# Patient Record
Sex: Female | Born: 1970 | Race: White | Hispanic: No | Smoking: Never smoker
Health system: Southern US, Community
[De-identification: ages and names within clinical notes are randomized; demographics above are authoritative.]

## PROBLEM LIST (undated history)

## (undated) DIAGNOSIS — B009 Herpesviral infection, unspecified: Secondary | ICD-10-CM

## (undated) DIAGNOSIS — K219 Gastro-esophageal reflux disease without esophagitis: Secondary | ICD-10-CM

## (undated) DIAGNOSIS — J302 Other seasonal allergic rhinitis: Secondary | ICD-10-CM

## (undated) HISTORY — PX: BREAST BIOPSY: SHX20

## (undated) HISTORY — PX: COLONOSCOPY: SHX174

## (undated) HISTORY — PX: TUBAL LIGATION: SHX77

## (undated) HISTORY — PX: WISDOM TOOTH EXTRACTION: SHX21

## (undated) HISTORY — PX: UPPER GI ENDOSCOPY: SHX6162

## (undated) HISTORY — PX: BREAST EXCISIONAL BIOPSY: SUR124

---

## 2001-09-21 ENCOUNTER — Encounter: Admission: RE | Admit: 2001-09-21 | Discharge: 2001-09-21 | Payer: Self-pay | Admitting: Family Medicine

## 2001-09-21 ENCOUNTER — Encounter: Payer: Self-pay | Admitting: Family Medicine

## 2002-01-29 ENCOUNTER — Encounter: Payer: Self-pay | Admitting: Family Medicine

## 2002-01-29 ENCOUNTER — Encounter: Admission: RE | Admit: 2002-01-29 | Discharge: 2002-01-29 | Payer: Self-pay | Admitting: Family Medicine

## 2002-08-30 ENCOUNTER — Encounter: Admission: RE | Admit: 2002-08-30 | Discharge: 2002-08-30 | Payer: Self-pay | Admitting: Family Medicine

## 2002-08-30 ENCOUNTER — Encounter: Payer: Self-pay | Admitting: Family Medicine

## 2004-06-16 ENCOUNTER — Other Ambulatory Visit: Admission: RE | Admit: 2004-06-16 | Discharge: 2004-06-16 | Payer: Self-pay | Admitting: Family Medicine

## 2005-01-14 ENCOUNTER — Other Ambulatory Visit: Admission: RE | Admit: 2005-01-14 | Discharge: 2005-01-14 | Payer: Self-pay | Admitting: Family Medicine

## 2005-01-14 ENCOUNTER — Ambulatory Visit (HOSPITAL_BASED_OUTPATIENT_CLINIC_OR_DEPARTMENT_OTHER): Admission: RE | Admit: 2005-01-14 | Discharge: 2005-01-14 | Payer: Self-pay | Admitting: Orthopedic Surgery

## 2005-08-10 ENCOUNTER — Encounter: Admission: RE | Admit: 2005-08-10 | Discharge: 2005-08-10 | Payer: Self-pay | Admitting: Family Medicine

## 2005-12-31 ENCOUNTER — Encounter (INDEPENDENT_AMBULATORY_CARE_PROVIDER_SITE_OTHER): Payer: Self-pay | Admitting: Specialist

## 2005-12-31 ENCOUNTER — Ambulatory Visit (HOSPITAL_COMMUNITY): Admission: RE | Admit: 2005-12-31 | Discharge: 2005-12-31 | Payer: Self-pay | Admitting: Gastroenterology

## 2006-01-07 ENCOUNTER — Ambulatory Visit (HOSPITAL_COMMUNITY): Admission: RE | Admit: 2006-01-07 | Discharge: 2006-01-07 | Payer: Self-pay | Admitting: *Deleted

## 2006-05-23 ENCOUNTER — Emergency Department (HOSPITAL_COMMUNITY): Admission: EM | Admit: 2006-05-23 | Discharge: 2006-05-23 | Payer: Self-pay | Admitting: Emergency Medicine

## 2007-08-28 ENCOUNTER — Other Ambulatory Visit: Admission: RE | Admit: 2007-08-28 | Discharge: 2007-08-28 | Payer: Self-pay | Admitting: Family Medicine

## 2007-09-27 ENCOUNTER — Encounter: Admission: RE | Admit: 2007-09-27 | Discharge: 2007-09-27 | Payer: Self-pay | Admitting: Family Medicine

## 2009-01-31 ENCOUNTER — Other Ambulatory Visit: Admission: RE | Admit: 2009-01-31 | Discharge: 2009-01-31 | Payer: Self-pay | Admitting: Family Medicine

## 2009-01-31 ENCOUNTER — Encounter: Admission: RE | Admit: 2009-01-31 | Discharge: 2009-01-31 | Payer: Self-pay | Admitting: Family Medicine

## 2010-05-29 ENCOUNTER — Other Ambulatory Visit: Admission: RE | Admit: 2010-05-29 | Discharge: 2010-05-29 | Payer: Self-pay | Admitting: Family Medicine

## 2010-05-29 ENCOUNTER — Encounter: Admission: RE | Admit: 2010-05-29 | Discharge: 2010-05-29 | Payer: Self-pay | Admitting: Family Medicine

## 2010-12-20 ENCOUNTER — Encounter: Payer: Self-pay | Admitting: Family Medicine

## 2011-04-16 NOTE — Op Note (Signed)
[name], Flores                ACCOUNT NO.:  [id number]   MEDICAL RECORD NO.:  [id number]          PATIENT TYPE:  AMB   LOCATION:  DSC                          FACILITY:  [facility]   PHYSICIAN:  Nadara Mustard, MD     DATE OF BIRTH:  Nov 19, 1971   DATE OF PROCEDURE:  01/14/2005  DATE OF DISCHARGE:                                 OPERATIVE REPORT   PREOPERATIVE DIAGNOSIS:  Impingement syndrome, left ankle.   POSTOPERATIVE DIAGNOSIS:  Impingement syndrome, left ankle.   OPERATION PERFORMED:  Left ankle arthroscopy with debridement of synovitis.   SURGEON:  Nadara Mustard, M.D.   ANESTHESIA:  Popliteal block.   ESTIMATED BLOOD LOSS:  Minimal.   MEDICATIONS:  Antibiotics; 1 gram Kefzol.   TOURNIQUET TIME:  None.   DISPOSITION:  To the PACU in stable condition.   INDICATIONS FOR SURGERY:  The patient is a 40 year old woman with  impingement symptoms in her left ankle.  She has obtained temporary relief  with intra-articular injections.  She has failed conservative care, has pain  with activities of daily living and presents at this time for arthroscopic  intervention.  The risks and benefits were discussed including infection,  neurovascular injury, persistent pain and need for additional surgery.  The  patient states she understands and wishes to proceed at this time.   DESCRIPTION OF OPERATION:  The patient was brought to OR room 6 after  undergoing a popliteal block.  After an adequate level of anesthesia was  obtained the patient's left lower extremity was prepped using DuraPrep and  draped into a sterile field.  An 18-gauge spinal needle was inserted through  the anteromedial portal and the joint was infused with a total of 8 mL of  normal saline.  The skin was then incised and with blunt dissection was  carried down to the capsule.  A blunt trocar was inserted into the joint and  the scope was inserted through the anteromedial portal.   Using guidance from the camera  light against the skin an anterolateral  portal was established first with an 18-gauge spinal to avoid the  neurovascular structures.  The skin was then incised.  Blunt dissection was  carried down to the capsule, and the shaver and vapor wand were inserted  through the anterolateral portal.  Visualization showed a significant amount  of synovitis with the impingement anteriorly.  This was first debrided with  a shaver and then secondarily debrided with the vapor wand.  After  debridement of the synovitis, which involved both the mediolateral and  anterior aspects of the joint, the patient had a good articular surface with  no osteochondral defects of the talus or tibia.  After further survey of the  joint the instruments were removed.   The portals were closed using 3-0 nylon.  The wounds were covered with  Adaptic, orthopedic sponges, sterile Webril and a Coban dressing.  The  patient was placed in ankle traction throughout the case and this was  removed afterwards.  There was approximately 10 pounds of axial traction.   The patient  was then taken to the PACU in stable condition.   PLAN:  1.  The plan is to follow up in the office in two weeks.  2.  The patient has a prescription for Vicodin.  3.  The patient will be touch-down weightbearing on the left.  4.  The patient will change the dressing in two days.      MVD/MEDQ  D:  01/14/2005  T:  01/15/2005  Job:  [id number]

## 2011-04-16 NOTE — Op Note (Signed)
NAMESAIGE, CANTON                ACCOUNT NO.:  [id number]   MEDICAL RECORD NO.:  [id number]          PATIENT TYPE:  AMB   LOCATION:  SDC                           FACILITY:  [facility]   PHYSICIAN:  [place] B. Earlene Plater, M.D.  DATE OF BIRTH:  Mar 23, 1971   DATE OF PROCEDURE:  01/07/2006  DATE OF DISCHARGE:                                 OPERATIVE REPORT   PREOPERATIVE DIAGNOSIS:  Desires tubal sterilization.   POSTOPERATIVE DIAGNOSIS:  Desires tubal sterilization.   OPERATION/PROCEDURE:  Attempted Essure tubal sterilization with uterine  perforation with the hysteroscope converted to diagnostic laparoscopy with  coagulation of the uterine perforations site and bilateral tubal ligation  with bipolar cautery.   SURGEON:  Chester Holstein. Earlene Plater, M.D.   ASSISTANT:  None.   ANESTHESIA:  General.   SPECIMENS:  None.   ESTIMATED BLOOD LOSS:  Minimal.   COMPLICATIONS:  Uterine perforation as outlined above.   INDICATIONS:  The patient desires tubal sterilization.  She preferred the  Essure if possible and was advised of the risks including infection,  bleeding, uterine perforation, damage to surrounding organs, and 5-10%  chance of non-visualization or noncanalization of the tube requiring  laparoscopy to complete the procedure.   DESCRIPTION OF PROCEDURE:  The patient was taken to the operating room and  initially LMA anesthesia obtained. She was prepped and draped in the  standard fashion.  The bladder was emptied with in-and-out catheter.  Examination under anesthesia showed a mid plane to slightly anteverted  uterus.  No adnexal masses.   Speculum inserted and the cervix noted to be extremely narrow.  Paracervical  block placed.  Single-tooth tenaculum attached to the anterior lip of the  cervix.  The cervix was easily dilated with the tapered dilators up to a #19  without difficulty.  The Essure scope was then inserted but would not go  into the cavity without difficulty.  At the internal  os, there was slight  resistance which suddenly gave away and it was evident immediately that  uterine perforation had occurred as the sigmoid colon and surrounding fat  were visible.  It did not appear that perforation of the sigmoid had  occurred as it was still distant in the visual field.  Therefore, the  procedure was converted to laparoscopy.   Gowns and gloves were changed.  The patient was reprepped and draped.  Vertical incision made in the umbilicus, carried sharply through the fascia.  The fascia was opened and divided sharply and elevated with the Kocher  clamps.  Posterior sheath and peritoneum were elevated with Allis clamps and  entered sharply.  Pursestring suture of 0 Vicryl were placed around the  fascial defect.  Hasson cannula inserted and secured. Pneumoperitoneum was  obtained with CO2 gas.  Tendelenberg position obtained.  A 5 mm port was  placed in the left lower quadrant.  There was a collection of blood to the  right of midline at the fundus poteriorly consistent with perforation site.  This was irrigated and inspected.  There was a single perforation site  approximately 3-4 mm in diameter.  It was oozing slightly.  This was  coagulated with bipolar cautery.  The sigmoid colon was carefully inspected  along its entire course and there was no evidence for injury.  The  surrounding fat was also similarly undisturbed.  Small bowel was also  inspected and showed no signs of injury.   Each tube was then identified, followed to its fimbriated end.  It was then  cauterized in a triple burn fashion with bipolar cautery.   The pneumoperitoneum was reduced such that the pressure was below 6 mm and  the perforation site again inspected.  It was hemostatic.  Therefore, the  procedure was terminated.  The left lower quadrant port was removed and its  inspected laparoscopically.  It was hemostatic.   The scope was removed and gas released. Hasson cannula removed.  Fascial   defect at the umbilicus elevated with [employer] retractors.  Pursestring  suture snugged down. This obliterated the fascial defect.  No intra-  abdominal contents herniated through prior to closure.  Skin was closed at  the umbilicus and the left lower quadrant with subcuticular 4-0 Vicryl.   The patient tolerated the procedure well.  Counts were correct per the  operating room staff.      Gerri Spore B. Earlene Plater, M.D.  Electronically Signed     WBD/MEDQ  D:  01/07/2006  T:  01/07/2006  Job:  [id number]

## 2011-11-10 ENCOUNTER — Other Ambulatory Visit: Payer: Self-pay | Admitting: Family Medicine

## 2011-11-10 DIAGNOSIS — Z1231 Encounter for screening mammogram for malignant neoplasm of breast: Secondary | ICD-10-CM

## 2011-11-11 ENCOUNTER — Other Ambulatory Visit (HOSPITAL_COMMUNITY)
Admission: RE | Admit: 2011-11-11 | Discharge: 2011-11-11 | Disposition: A | Discharge status: Home / Self Care | Payer: BC Managed Care – PPO | Source: Ambulatory Visit | Attending: Family Medicine | Admitting: Family Medicine

## 2011-11-11 ENCOUNTER — Other Ambulatory Visit: Payer: Self-pay | Admitting: Family Medicine

## 2011-11-11 ENCOUNTER — Ambulatory Visit
Admission: RE | Admit: 2011-11-11 | Discharge: 2011-11-11 | Disposition: A | Discharge status: Home / Self Care | Payer: BC Managed Care – PPO | Source: Ambulatory Visit | Attending: Family Medicine | Admitting: Family Medicine

## 2011-11-11 DIAGNOSIS — Z124 Encounter for screening for malignant neoplasm of cervix: Secondary | ICD-10-CM | POA: Insufficient documentation

## 2011-11-11 DIAGNOSIS — Z1231 Encounter for screening mammogram for malignant neoplasm of breast: Secondary | ICD-10-CM

## 2011-11-11 DIAGNOSIS — R87810 Cervical high risk human papillomavirus (HPV) DNA test positive: Secondary | ICD-10-CM | POA: Insufficient documentation

## 2012-07-10 ENCOUNTER — Other Ambulatory Visit (HOSPITAL_COMMUNITY)
Admission: RE | Admit: 2012-07-10 | Discharge: 2012-07-10 | Disposition: A | Discharge status: Home / Self Care | Payer: BC Managed Care – PPO | Source: Ambulatory Visit | Attending: Family Medicine | Admitting: Family Medicine

## 2012-07-10 DIAGNOSIS — R87619 Unspecified abnormal cytological findings in specimens from cervix uteri: Secondary | ICD-10-CM | POA: Insufficient documentation

## 2012-07-11 ENCOUNTER — Other Ambulatory Visit: Payer: Self-pay | Admitting: Family Medicine

## 2012-11-20 ENCOUNTER — Other Ambulatory Visit: Payer: Self-pay | Admitting: Family Medicine

## 2012-11-20 DIAGNOSIS — Z1231 Encounter for screening mammogram for malignant neoplasm of breast: Secondary | ICD-10-CM

## 2012-12-15 ENCOUNTER — Other Ambulatory Visit (HOSPITAL_COMMUNITY)
Admission: RE | Admit: 2012-12-15 | Discharge: 2012-12-15 | Disposition: A | Discharge status: Home / Self Care | Payer: BC Managed Care – PPO | Source: Ambulatory Visit | Attending: Family Medicine | Admitting: Family Medicine

## 2012-12-15 ENCOUNTER — Ambulatory Visit
Admission: RE | Admit: 2012-12-15 | Discharge: 2012-12-15 | Disposition: A | Discharge status: Home / Self Care | Payer: BC Managed Care – PPO | Source: Ambulatory Visit | Attending: Family Medicine | Admitting: Family Medicine

## 2012-12-15 ENCOUNTER — Other Ambulatory Visit: Payer: Self-pay | Admitting: Internal Medicine

## 2012-12-15 DIAGNOSIS — R87612 Low grade squamous intraepithelial lesion on cytologic smear of cervix (LGSIL): Secondary | ICD-10-CM | POA: Insufficient documentation

## 2012-12-15 DIAGNOSIS — Z1231 Encounter for screening mammogram for malignant neoplasm of breast: Secondary | ICD-10-CM

## 2013-11-19 ENCOUNTER — Other Ambulatory Visit: Payer: Self-pay

## 2013-11-19 DIAGNOSIS — Z1231 Encounter for screening mammogram for malignant neoplasm of breast: Secondary | ICD-10-CM

## 2013-12-17 ENCOUNTER — Ambulatory Visit
Admission: RE | Admit: 2013-12-17 | Discharge: 2013-12-17 | Disposition: A | Discharge status: Home / Self Care | Payer: BC Managed Care – PPO | Source: Ambulatory Visit

## 2013-12-17 DIAGNOSIS — Z1231 Encounter for screening mammogram for malignant neoplasm of breast: Secondary | ICD-10-CM

## 2014-01-16 ENCOUNTER — Other Ambulatory Visit (HOSPITAL_COMMUNITY)
Admission: RE | Admit: 2014-01-16 | Discharge: 2014-01-16 | Disposition: A | Discharge status: Home / Self Care | Payer: BC Managed Care – PPO | Source: Ambulatory Visit | Attending: Family Medicine | Admitting: Family Medicine

## 2014-01-16 ENCOUNTER — Other Ambulatory Visit: Payer: Self-pay | Admitting: Family Medicine

## 2014-01-16 DIAGNOSIS — Z124 Encounter for screening for malignant neoplasm of cervix: Secondary | ICD-10-CM | POA: Insufficient documentation

## 2014-11-27 ENCOUNTER — Other Ambulatory Visit: Payer: Self-pay

## 2014-11-27 DIAGNOSIS — Z1231 Encounter for screening mammogram for malignant neoplasm of breast: Secondary | ICD-10-CM

## 2014-12-23 ENCOUNTER — Ambulatory Visit
Admission: RE | Admit: 2014-12-23 | Discharge: 2014-12-23 | Disposition: A | Discharge status: Home / Self Care | Payer: BC Managed Care – PPO | Source: Ambulatory Visit

## 2014-12-23 DIAGNOSIS — Z1231 Encounter for screening mammogram for malignant neoplasm of breast: Secondary | ICD-10-CM

## 2015-12-08 ENCOUNTER — Other Ambulatory Visit: Payer: Self-pay

## 2015-12-08 DIAGNOSIS — Z1231 Encounter for screening mammogram for malignant neoplasm of breast: Secondary | ICD-10-CM

## 2015-12-25 ENCOUNTER — Ambulatory Visit
Admission: RE | Admit: 2015-12-25 | Discharge: 2015-12-25 | Disposition: A | Discharge status: Home / Self Care | Payer: BC Managed Care – PPO | Source: Ambulatory Visit

## 2015-12-25 DIAGNOSIS — Z1231 Encounter for screening mammogram for malignant neoplasm of breast: Secondary | ICD-10-CM

## 2016-02-26 ENCOUNTER — Other Ambulatory Visit (HOSPITAL_COMMUNITY)
Admission: RE | Admit: 2016-02-26 | Discharge: 2016-02-26 | Disposition: A | Discharge status: Home / Self Care | Payer: BC Managed Care – PPO | Source: Ambulatory Visit | Attending: Family Medicine | Admitting: Family Medicine

## 2016-02-26 ENCOUNTER — Other Ambulatory Visit: Payer: Self-pay | Admitting: Family Medicine

## 2016-02-26 DIAGNOSIS — Z124 Encounter for screening for malignant neoplasm of cervix: Secondary | ICD-10-CM | POA: Insufficient documentation

## 2016-03-01 LAB — CYTOLOGY - PAP

## 2016-11-24 ENCOUNTER — Other Ambulatory Visit: Payer: Self-pay | Admitting: Family Medicine

## 2016-11-24 DIAGNOSIS — Z1231 Encounter for screening mammogram for malignant neoplasm of breast: Secondary | ICD-10-CM

## 2016-12-27 ENCOUNTER — Ambulatory Visit
Admission: RE | Admit: 2016-12-27 | Discharge: 2016-12-27 | Disposition: A | Discharge status: Home / Self Care | Payer: BC Managed Care – PPO | Source: Ambulatory Visit | Attending: Family Medicine | Admitting: Family Medicine

## 2016-12-27 DIAGNOSIS — Z1231 Encounter for screening mammogram for malignant neoplasm of breast: Secondary | ICD-10-CM

## 2017-02-28 ENCOUNTER — Other Ambulatory Visit (HOSPITAL_COMMUNITY)
Admission: RE | Admit: 2017-02-28 | Discharge: 2017-02-28 | Disposition: A | Discharge status: Home / Self Care | Payer: BC Managed Care – PPO | Source: Ambulatory Visit | Attending: Family Medicine | Admitting: Family Medicine

## 2017-02-28 ENCOUNTER — Other Ambulatory Visit: Payer: Self-pay | Admitting: Family Medicine

## 2017-02-28 DIAGNOSIS — Z124 Encounter for screening for malignant neoplasm of cervix: Secondary | ICD-10-CM | POA: Insufficient documentation

## 2017-03-03 LAB — CYTOLOGY - PAP: Diagnosis: NEGATIVE

## 2017-08-02 ENCOUNTER — Other Ambulatory Visit: Payer: Self-pay | Admitting: Nurse Practitioner

## 2017-10-06 ENCOUNTER — Other Ambulatory Visit: Payer: Self-pay | Admitting: Nurse Practitioner

## 2017-12-06 ENCOUNTER — Other Ambulatory Visit: Payer: Self-pay | Admitting: Family Medicine

## 2017-12-06 DIAGNOSIS — Z1231 Encounter for screening mammogram for malignant neoplasm of breast: Secondary | ICD-10-CM

## 2017-12-29 ENCOUNTER — Ambulatory Visit
Admission: RE | Admit: 2017-12-29 | Discharge: 2017-12-29 | Disposition: A | Discharge status: Home / Self Care | Payer: BC Managed Care – PPO | Source: Ambulatory Visit | Attending: Family Medicine | Admitting: Family Medicine

## 2017-12-29 DIAGNOSIS — Z1231 Encounter for screening mammogram for malignant neoplasm of breast: Secondary | ICD-10-CM

## 2018-03-07 NOTE — Patient Instructions (Addendum)
Your procedure is scheduled on: Wednesday, April 17  [facility] am  Pick up the phone at the desk and dial [phone].  Call this number if you have problems the morning of surgery: [phone].  Remember: Do NOT eat or Do NOT drink clear liquids (including water) after midnight Tuesday.  Take these medicines the morning of surgery with a SIP OF WATER: omeprazole-sodium bicarbonate, claritin, valtrex and flonase   Stop herbal medications and vitamin supplements and ibuprofen at this time.  Do NOT wear jewelry (body piercing), metal hair clips/bobby pins, make-up, or nail polish. Do NOT wear lotions, powders, or perfumes.  You may wear deoderant. Do NOT shave for 48 hours prior to surgery. Do NOT bring valuables to the hospital.  Leave suitcase in car.  After surgery it may be brought to your room.  For patients admitted to the hospital, checkout time is 11:00 AM the day of discharge. Home boyfriend Thereasa DistanceRodney cell [phone].

## 2018-03-07 NOTE — H&P (Signed)
47yo G0 who presents for TLH/BS scheduled due to abnormal bleeding.  In review, she has been having daily irregular bleeding. Bleeding is minimal using a few pads per day. She recently tried the Depot, but noted no improvement. In review, about 2 years ago she had noted a change in her menses. Some months, she would skip a period other months she would have a very heavy period. Periods would last at least 5-7 days using at least 6-8 overnight pads within a 24hr period. Kess had tried Provera; however, she continued to have daily spotting. She started She also has significant dysmenorrhea and left sided cramping with the bleeding. She has some relief with OTC medication, but since the pain only last for a few minutes, she often doesn't take anything. It's more that the pain will wake her up from sleep. She is physically and emotionally drained from the bleeding and desires to proceed with surgical intervention.  Work up completed and has included: 11/8: TVUS: 7cm anteverted uterus- normal size and shape. Left ovary with 1.7cm simple follicle. Normal right ovary. 11/8: EMB: Proliferative endometrium 12/13: HGb 12.6.      ROS:  CONSTITUTIONAL:  no Chills. no Fever. no Night sweats.  HEENT:  Blurrred vision no. no Double vision.  CARDIOLOGY:  no Chest pain.  RESPIRATORY:  no Shortness of breath. no Cough.  UROLOGY:  no Urinary frequency. no Urinary incontinence. no Urinary urgency.  GASTROENTEROLOGY:  no Abdominal pain. no Appetite change. no Change in bowel movements.  FEMALE REPRODUCTIVE:  no Breast lumps or discharge. no Breast pain. no Vaginal irritation. no Vaginal itching.  NEUROLOGY:  no Dizziness. no Headache. no Loss of consciousness.  PSYCHOLOGY:  no Anxiety. no Depression.  SKIN:  no Rash. no Hives.  HEMATOLOGY/LYMPH:  no Anemia. Fatigue yes. Using Blood Thinners no.         Medical History: Allergies, GERD, Migraine HA, HSV       Gyn History:  Sexual activity currently  sexually active.  Periods : irregular.  Birth control BTL.  Last pap smear date 02/28/17 - WNL.  Last mammogram date 12/27/16.  Abnormal pap smear CIN I in 2011.  Denies H/O STD.  GYN procedures 10/06/17 - EMB 08/02/17 - EMB.        OB History:  Never been pregnant per patient.      Medication: Zegerid OTC(Omeprazole-Sodium Bicarbonate) 20-1100 MG Capsule 1 capsule on an empty stomach Orally Once a day     Metronidazole 0.75 % Cream 1 application to affected area Externally once-twice a day     Valtrex(ValACYclovir HCl) 500 MG Tablet 1 tablets Orally once a day     Multivitamins Tablet as directed Orally once a day     Provera(Medroxyprogesterone) 10 MG Tablet 2 tablets with food Orally every evening     Sudafed 12 Hour(Pseudoephedrine HCl ER) 120 MG Tablet Extended Release 12 Hour 1 tablet as needed Orally every 12 hrs     Mucinex(GuaiFENesin CR) 600 MG Tablet Extended Release 12 Hour 1 tablet as needed Orally every 12 hrs     Flonase(Fluticasone Propionate) 50 MCG/ACT Suspension 2 sprays Nasally Once a day       Surgical History: Left Breast Lump (beneign) 1994, Tubal Ligation .     Family History: Father: deceased, Aneurysm. Mother: deceased, Breast Ca, diagnosed with Breast cancer.        Social History:  General:  Tobacco use  cigarettes: Never smoked Tobacco history last updated 03/01/2018 no EXPOSURE TO PASSIVE  SMOKE.  Alcohol: yes, occasionally.  no Recreational drug use.  Exercise: yes.  Marital Status: Separated.  Children: none.  OCCUPATION: employed, [employer] (Print production planner).        Medications: Unknown Zegerid OTC(Omeprazole-Sodium Bicarbonate) 20-1100 MG Capsule 1 capsule on an empty stomach Orally Once a day, Unknown Metronidazole 0.75 % Cream 1 application to affected area Externally once-twice a day, Unknown Valtrex(ValACYclovir HCl) 500 MG Tablet 1 tablets Orally once a day, Unknown Multivitamins Tablet as directed Orally once a day, Unknown  Provera(Medroxyprogesterone) 10 MG Tablet 2 tablets with food Orally every evening, Unknown Depo-Provera(Medroxyprogesterone Acetate) 150 MG/ML Suspension Prefilled Syringe 1 ml Intramuscular every 3 months, Unknown Sudafed 12 Hour(Pseudoephedrine HCl ER) 120 MG Tablet Extended Release 12 Hour 1 tablet as needed Orally every 12 hrs, Unknown Mucinex(GuaiFENesin CR) 600 MG Tablet Extended Release 12 Hour 1 tablet as needed Orally every 12 hrs, Unknown Flonase(Fluticasone Propionate) 50 MCG/ACT Suspension 2 sprays Nasally Once a day, Medication List reviewed and reconciled with the patient   O: examination performed in office Wt 240, Wt change 2.5 lb, Ht 64.5, BMI 40.56, Pulse sitting 84, BP sitting 142/74, Repeat BP 142/74.      Examination:  General Examination:  CONSTITUTIONAL: well developed, well nourished.  SKIN: warm and dry, no rashes.  NECK: supple, normal appearance.  LUNGS: clear to auscultation bilaterally, no wheezes, rhonchi, rales.  HEART: no murmurs, regular rate and rhythm.  ABDOMEN: obese, soft and not tender, no masses palpated, no rebound, no rigidity.  FEMALE GENITOURINARY: normal external genitalia, labia - unremarkable, labia - unremarkable, vagina - pink moist mucosa, no lesions or abnormal discharge, cervix - no discharge or lesions or CMT, adnexa - no masses or tenderness, uterus - nontender and normal size on palpation.  MUSCULOSKELETAL no calf tenderness bilaterally.  EXTREMITIES: no edema present.  PSYCH: appropriate mood and affect.     A/p: 47yo G0 who presents for total laparoscopic hysterectomy, bilateral salpingectomy due to abnormal bleeding -NPO -LR @ 125cc/hr -SCDs to OR -Ancef 2g IV to OR -Risk/benefits and alternatives reviewed including but not limited to risk of bleeding, infection, injury to surrounds tissue and organs as well as potential need for open laparotomy.  Reviewed ovarian preservation.  Questions and concerns were addressed and pt wishes to  proceed.  Myna Hidalgo, DO [phone] (cell) [phone] (office)

## 2018-03-13 ENCOUNTER — Encounter (HOSPITAL_COMMUNITY)
Admission: RE | Admit: 2018-03-13 | Discharge: 2018-03-13 | Disposition: A | Discharge status: Home / Self Care | Payer: BC Managed Care – PPO | Source: Ambulatory Visit | Attending: Obstetrics & Gynecology | Admitting: Obstetrics & Gynecology

## 2018-03-13 ENCOUNTER — Other Ambulatory Visit: Payer: Self-pay

## 2018-03-13 ENCOUNTER — Encounter (HOSPITAL_COMMUNITY): Payer: Self-pay

## 2018-03-13 DIAGNOSIS — N800 Endometriosis of uterus: Secondary | ICD-10-CM | POA: Diagnosis not present

## 2018-03-13 DIAGNOSIS — Z6841 Body Mass Index (BMI) 40.0 and over, adult: Secondary | ICD-10-CM | POA: Diagnosis not present

## 2018-03-13 DIAGNOSIS — N939 Abnormal uterine and vaginal bleeding, unspecified: Secondary | ICD-10-CM | POA: Diagnosis present

## 2018-03-13 DIAGNOSIS — Z79899 Other long term (current) drug therapy: Secondary | ICD-10-CM | POA: Diagnosis not present

## 2018-03-13 DIAGNOSIS — N838 Other noninflammatory disorders of ovary, fallopian tube and broad ligament: Secondary | ICD-10-CM | POA: Diagnosis not present

## 2018-03-13 HISTORY — DX: Gastro-esophageal reflux disease without esophagitis: K21.9

## 2018-03-13 HISTORY — DX: Other seasonal allergic rhinitis: J30.2

## 2018-03-13 HISTORY — DX: Herpesviral infection, unspecified: B00.9

## 2018-03-13 LAB — CBC
HEMATOCRIT: 38.9 % (ref 36.0–46.0)
Hemoglobin: 13 g/dL (ref 12.0–15.0)
MCH: 30.6 pg (ref 26.0–34.0)
MCHC: 33.4 g/dL (ref 30.0–36.0)
MCV: 91.5 fL (ref 78.0–100.0)
Platelets: 248 10*3/uL (ref 150–400)
RBC: 4.25 MIL/uL (ref 3.87–5.11)
RDW: 13.3 % (ref 11.5–15.5)
WBC: 8.1 10*3/uL (ref 4.0–10.5)

## 2018-03-13 LAB — COMPREHENSIVE METABOLIC PANEL
ALT: 25 U/L (ref 14–54)
AST: 21 U/L (ref 15–41)
Albumin: 3.7 g/dL (ref 3.5–5.0)
Alkaline Phosphatase: 51 U/L (ref 38–126)
Anion gap: 7 (ref 5–15)
BILIRUBIN TOTAL: 0.6 mg/dL (ref 0.3–1.2)
BUN: 16 mg/dL (ref 6–20)
CO2: 24 mmol/L (ref 22–32)
CREATININE: 0.68 mg/dL (ref 0.44–1.00)
Calcium: 9.1 mg/dL (ref 8.9–10.3)
Chloride: 107 mmol/L (ref 101–111)
Glucose, Bld: 81 mg/dL (ref 65–99)
POTASSIUM: 4.1 mmol/L (ref 3.5–5.1)
Sodium: 138 mmol/L (ref 135–145)
TOTAL PROTEIN: 7.2 g/dL (ref 6.5–8.1)

## 2018-03-13 LAB — ABO/RH: ABO/RH(D): O POS

## 2018-03-13 LAB — TYPE AND SCREEN
ABO/RH(D): O POS
ANTIBODY SCREEN: NEGATIVE

## 2018-03-13 NOTE — Pre-Procedure Instructions (Signed)
Patient has a bone contusion on Left arm.  Patient is requesting that we use right arm for IV, blood draws and BP readings for DOS.  Restricted arm band placed on chart.

## 2018-03-14 NOTE — Anesthesia Preprocedure Evaluation (Addendum)
Anesthesia Evaluation  Patient identified by MRN, date of birth, ID band Patient awake    Reviewed: Allergy & Precautions, H&P , Patient's Chart, lab work & pertinent test results, reviewed documented beta blocker date and time   Airway Mallampati: II  TM Distance: >3 FB Neck ROM: full    Dental no notable dental hx.    Pulmonary    Pulmonary exam normal breath sounds clear to auscultation       Cardiovascular  Rhythm:regular Rate:Normal     Neuro/Psych    GI/Hepatic   Endo/Other  Morbid obesity  Renal/GU      Musculoskeletal   Abdominal   Peds  Hematology   Anesthesia Other Findings   Reproductive/Obstetrics                            Anesthesia Physical Anesthesia Plan  ASA: III  Anesthesia Plan: General   Post-op Pain Management:    Induction: Intravenous  PONV Risk Score and Plan: 3 and Treatment may vary due to age or medical condition, Ondansetron and Scopolamine patch - Pre-op  Airway Management Planned: Oral ETT  Additional Equipment:   Intra-op Plan:   Post-operative Plan: Extubation in OR  Informed Consent: I have reviewed the patients History and Physical, chart, labs and discussed the procedure including the risks, benefits and alternatives for the proposed anesthesia with the patient or authorized representative who has indicated his/her understanding and acceptance.   Dental Advisory Given  Plan Discussed with: CRNA and Surgeon  Anesthesia Plan Comments: (  )       Anesthesia Quick Evaluation

## 2018-03-15 ENCOUNTER — Observation Stay (HOSPITAL_COMMUNITY)
Admission: RE | Admit: 2018-03-15 | Discharge: 2018-03-16 | Disposition: A | Discharge status: Home / Self Care | Payer: BC Managed Care – PPO | Source: Ambulatory Visit | Attending: Obstetrics & Gynecology | Admitting: Obstetrics & Gynecology

## 2018-03-15 ENCOUNTER — Ambulatory Visit (HOSPITAL_COMMUNITY): Payer: BC Managed Care – PPO | Admitting: Anesthesiology

## 2018-03-15 ENCOUNTER — Encounter (HOSPITAL_COMMUNITY): Payer: Self-pay

## 2018-03-15 ENCOUNTER — Encounter (HOSPITAL_COMMUNITY)
Admission: RE | Disposition: A | Discharge status: Home / Self Care | Payer: Self-pay | Source: Ambulatory Visit | Attending: Obstetrics & Gynecology

## 2018-03-15 ENCOUNTER — Other Ambulatory Visit: Payer: Self-pay

## 2018-03-15 DIAGNOSIS — N838 Other noninflammatory disorders of ovary, fallopian tube and broad ligament: Secondary | ICD-10-CM | POA: Insufficient documentation

## 2018-03-15 DIAGNOSIS — Z79899 Other long term (current) drug therapy: Secondary | ICD-10-CM | POA: Insufficient documentation

## 2018-03-15 DIAGNOSIS — N800 Endometriosis of uterus: Principal | ICD-10-CM | POA: Insufficient documentation

## 2018-03-15 DIAGNOSIS — N939 Abnormal uterine and vaginal bleeding, unspecified: Secondary | ICD-10-CM | POA: Diagnosis present

## 2018-03-15 DIAGNOSIS — Z6841 Body Mass Index (BMI) 40.0 and over, adult: Secondary | ICD-10-CM | POA: Insufficient documentation

## 2018-03-15 HISTORY — DX: Abnormal uterine and vaginal bleeding, unspecified: N93.9

## 2018-03-15 HISTORY — PX: TOTAL LAPAROSCOPIC HYSTERECTOMY WITH SALPINGECTOMY: SHX6742

## 2018-03-15 SURGERY — HYSTERECTOMY, TOTAL, LAPAROSCOPIC, WITH SALPINGECTOMY
Anesthesia: General | Site: Abdomen | Laterality: Bilateral

## 2018-03-15 MED ORDER — OXYCODONE-ACETAMINOPHEN 5-325 MG PO TABS
1.0000 | ORAL_TABLET | ORAL | Status: DC | PRN
  Administered 2018-03-15 – 2018-03-16 (×4): 1 via ORAL
  Filled 2018-03-15 (×4): qty 1

## 2018-03-15 MED ORDER — CEFAZOLIN SODIUM-DEXTROSE 2-4 GM/100ML-% IV SOLN
2.0000 g | INTRAVENOUS | Status: AC
  Administered 2018-03-15: 2 g via INTRAVENOUS

## 2018-03-15 MED ORDER — ONDANSETRON HCL 4 MG/2ML IJ SOLN
4.0000 mg | Freq: Four times a day (QID) | INTRAMUSCULAR | Status: DC | PRN
  Administered 2018-03-15: 4 mg via INTRAVENOUS
  Filled 2018-03-15: qty 2

## 2018-03-15 MED ORDER — PROPOFOL 10 MG/ML IV BOLUS
INTRAVENOUS | Status: AC
  Filled 2018-03-15: qty 20

## 2018-03-15 MED ORDER — LIDOCAINE HCL (CARDIAC) 20 MG/ML IV SOLN
INTRAVENOUS | Status: DC | PRN
  Administered 2018-03-15: 100 mg via INTRAVENOUS

## 2018-03-15 MED ORDER — BUPIVACAINE HCL (PF) 0.25 % IJ SOLN
INTRAMUSCULAR | Status: AC
  Filled 2018-03-15: qty 30

## 2018-03-15 MED ORDER — MENTHOL 3 MG MT LOZG: 1.0000 | LOZENGE | OROMUCOSAL | Status: DC | PRN

## 2018-03-15 MED ORDER — PROPOFOL 10 MG/ML IV BOLUS
INTRAVENOUS | Status: DC | PRN
  Administered 2018-03-15: 200 mg via INTRAVENOUS

## 2018-03-15 MED ORDER — FENTANYL CITRATE (PF) 250 MCG/5ML IJ SOLN
INTRAMUSCULAR | Status: DC | PRN
  Administered 2018-03-15 (×2): 100 ug via INTRAVENOUS
  Administered 2018-03-15: 50 ug via INTRAVENOUS
  Administered 2018-03-15: 100 ug via INTRAVENOUS
  Administered 2018-03-15: 50 ug via INTRAVENOUS
  Administered 2018-03-15: 25 ug via INTRAVENOUS

## 2018-03-15 MED ORDER — DEXAMETHASONE SODIUM PHOSPHATE 4 MG/ML IJ SOLN
INTRAMUSCULAR | Status: AC
  Filled 2018-03-15: qty 1

## 2018-03-15 MED ORDER — BUPIVACAINE HCL (PF) 0.25 % IJ SOLN
INTRAMUSCULAR | Status: DC | PRN
  Administered 2018-03-15: 30 mL

## 2018-03-15 MED ORDER — SIMETHICONE 80 MG PO CHEW: 80.0000 mg | CHEWABLE_TABLET | Freq: Four times a day (QID) | ORAL | Status: DC | PRN

## 2018-03-15 MED ORDER — FENTANYL CITRATE (PF) 100 MCG/2ML IJ SOLN
INTRAMUSCULAR | Status: AC
  Administered 2018-03-15: 50 ug via INTRAVENOUS
  Filled 2018-03-15: qty 2

## 2018-03-15 MED ORDER — SUGAMMADEX SODIUM 200 MG/2ML IV SOLN
INTRAVENOUS | Status: AC
  Filled 2018-03-15: qty 2

## 2018-03-15 MED ORDER — ROCURONIUM BROMIDE 100 MG/10ML IV SOLN
INTRAVENOUS | Status: DC | PRN
  Administered 2018-03-15: 10 mg via INTRAVENOUS
  Administered 2018-03-15: 60 mg via INTRAVENOUS
  Administered 2018-03-15: 10 mg via INTRAVENOUS

## 2018-03-15 MED ORDER — SODIUM CHLORIDE 0.9 % IJ SOLN
INTRAMUSCULAR | Status: AC
  Filled 2018-03-15: qty 10

## 2018-03-15 MED ORDER — KETOROLAC TROMETHAMINE 30 MG/ML IJ SOLN
30.0000 mg | Freq: Four times a day (QID) | INTRAMUSCULAR | Status: DC
  Administered 2018-03-15 – 2018-03-16 (×3): 30 mg via INTRAVENOUS
  Filled 2018-03-15 (×2): qty 1

## 2018-03-15 MED ORDER — ONDANSETRON HCL 4 MG/2ML IJ SOLN
INTRAMUSCULAR | Status: AC
Start: 2018-03-15 — End: ?
  Filled 2018-03-15: qty 2

## 2018-03-15 MED ORDER — PHENYLEPHRINE 40 MCG/ML (10ML) SYRINGE FOR IV PUSH (FOR BLOOD PRESSURE SUPPORT)
PREFILLED_SYRINGE | INTRAVENOUS | Status: AC
  Filled 2018-03-15: qty 10

## 2018-03-15 MED ORDER — FENTANYL CITRATE (PF) 250 MCG/5ML IJ SOLN
INTRAMUSCULAR | Status: AC
  Filled 2018-03-15: qty 5

## 2018-03-15 MED ORDER — ROCURONIUM BROMIDE 100 MG/10ML IV SOLN
INTRAVENOUS | Status: AC
  Filled 2018-03-15: qty 1

## 2018-03-15 MED ORDER — LIDOCAINE HCL (CARDIAC) 20 MG/ML IV SOLN
INTRAVENOUS | Status: AC
  Filled 2018-03-15: qty 5

## 2018-03-15 MED ORDER — FENTANYL CITRATE (PF) 100 MCG/2ML IJ SOLN
25.0000 ug | INTRAMUSCULAR | Status: DC | PRN
  Administered 2018-03-15: 50 ug via INTRAVENOUS

## 2018-03-15 MED ORDER — SCOPOLAMINE 1 MG/3DAYS TD PT72
1.0000 | MEDICATED_PATCH | Freq: Once | TRANSDERMAL | Status: DC
  Administered 2018-03-15: 1.5 mg via TRANSDERMAL

## 2018-03-15 MED ORDER — LACTATED RINGERS IV SOLN
INTRAVENOUS | Status: DC
  Administered 2018-03-15: 12:00:00 via INTRAVENOUS

## 2018-03-15 MED ORDER — KETOROLAC TROMETHAMINE 30 MG/ML IJ SOLN
INTRAMUSCULAR | Status: DC | PRN
  Administered 2018-03-15: 30 mg via INTRAVENOUS

## 2018-03-15 MED ORDER — DOCUSATE SODIUM 100 MG PO CAPS
100.0000 mg | ORAL_CAPSULE | Freq: Two times a day (BID) | ORAL | Status: DC
  Administered 2018-03-15: 100 mg via ORAL
  Filled 2018-03-15: qty 1

## 2018-03-15 MED ORDER — MIDAZOLAM HCL 2 MG/2ML IJ SOLN
INTRAMUSCULAR | Status: DC | PRN
  Administered 2018-03-15: 2 mg via INTRAVENOUS

## 2018-03-15 MED ORDER — CEFAZOLIN SODIUM-DEXTROSE 2-4 GM/100ML-% IV SOLN
INTRAVENOUS | Status: AC
  Filled 2018-03-15: qty 100

## 2018-03-15 MED ORDER — KETOROLAC TROMETHAMINE 30 MG/ML IJ SOLN
30.0000 mg | Freq: Once | INTRAMUSCULAR | Status: AC
  Filled 2018-03-15: qty 1

## 2018-03-15 MED ORDER — DEXAMETHASONE SODIUM PHOSPHATE 4 MG/ML IJ SOLN
INTRAMUSCULAR | Status: DC | PRN
  Administered 2018-03-15: 4 mg via INTRAVENOUS

## 2018-03-15 MED ORDER — LACTATED RINGERS IV SOLN
INTRAVENOUS | Status: DC
  Administered 2018-03-15 (×2): via INTRAVENOUS

## 2018-03-15 MED ORDER — MIDAZOLAM HCL 2 MG/2ML IJ SOLN
INTRAMUSCULAR | Status: AC
  Filled 2018-03-15: qty 2

## 2018-03-15 MED ORDER — ONDANSETRON HCL 4 MG PO TABS: 4.0000 mg | ORAL_TABLET | Freq: Four times a day (QID) | ORAL | Status: DC | PRN

## 2018-03-15 MED ORDER — HYDROMORPHONE HCL 1 MG/ML IJ SOLN
1.0000 mg | Freq: Once | INTRAMUSCULAR | Status: AC
  Administered 2018-03-15: 1 mg via INTRAVENOUS
  Filled 2018-03-15: qty 1

## 2018-03-15 MED ORDER — PHENYLEPHRINE HCL 10 MG/ML IJ SOLN
INTRAMUSCULAR | Status: DC | PRN
  Administered 2018-03-15: 80 ug via INTRAVENOUS

## 2018-03-15 MED ORDER — SODIUM CHLORIDE 0.9 % IJ SOLN
INTRAMUSCULAR | Status: DC | PRN
  Administered 2018-03-15: 10 mL

## 2018-03-15 MED ORDER — KETOROLAC TROMETHAMINE 30 MG/ML IJ SOLN
INTRAMUSCULAR | Status: AC
  Filled 2018-03-15: qty 1

## 2018-03-15 MED ORDER — SUGAMMADEX SODIUM 200 MG/2ML IV SOLN
INTRAVENOUS | Status: DC | PRN
  Administered 2018-03-15: 220 mg via INTRAVENOUS

## 2018-03-15 MED ORDER — PANTOPRAZOLE SODIUM 40 MG PO TBEC: 40.0000 mg | DELAYED_RELEASE_TABLET | Freq: Every day | ORAL | Status: DC

## 2018-03-15 MED ORDER — SCOPOLAMINE 1 MG/3DAYS TD PT72
MEDICATED_PATCH | TRANSDERMAL | Status: AC
  Administered 2018-03-15: 1.5 mg via TRANSDERMAL
  Filled 2018-03-15: qty 1

## 2018-03-15 MED ORDER — HYDROMORPHONE HCL 1 MG/ML IJ SOLN: 1.0000 mg | Freq: Four times a day (QID) | INTRAMUSCULAR | Status: DC | PRN

## 2018-03-15 MED ORDER — SODIUM CHLORIDE 0.9 % IR SOLN
Status: DC | PRN
  Administered 2018-03-15: 3000 mL

## 2018-03-15 MED ORDER — KETOROLAC TROMETHAMINE 30 MG/ML IJ SOLN: 30.0000 mg | Freq: Four times a day (QID) | INTRAMUSCULAR | Status: DC

## 2018-03-15 MED ORDER — LIDOCAINE HCL 1 % IJ SOLN
INTRAMUSCULAR | Status: AC
  Filled 2018-03-15: qty 20

## 2018-03-15 MED ORDER — LACTATED RINGERS IV SOLN
INTRAVENOUS | Status: DC
  Administered 2018-03-15: 125 mL/h via INTRAVENOUS
  Administered 2018-03-15: 10:00:00 via INTRAVENOUS

## 2018-03-15 MED ORDER — ONDANSETRON HCL 4 MG/2ML IJ SOLN
INTRAMUSCULAR | Status: DC | PRN
  Administered 2018-03-15: 4 mg via INTRAVENOUS

## 2018-03-15 MED ORDER — SUGAMMADEX SODIUM 200 MG/2ML IV SOLN
INTRAVENOUS | Status: AC
Start: 2018-03-15 — End: ?
  Filled 2018-03-15: qty 2

## 2018-03-15 SURGICAL SUPPLY — 44 items
CABLE HIGH FREQUENCY MONO STRZ (ELECTRODE) ×3 IMPLANT
COVER MAYO STAND STRL (DRAPES) ×3 IMPLANT
DERMABOND ADVANCED (GAUZE/BANDAGES/DRESSINGS) ×2
DERMABOND ADVANCED .7 DNX12 (GAUZE/BANDAGES/DRESSINGS) ×1 IMPLANT
DISSECTOR BLUNT TIP ENDO 5MM (MISCELLANEOUS) ×3 IMPLANT
DRSG OPSITE POSTOP 3X4 (GAUZE/BANDAGES/DRESSINGS) IMPLANT
DURAPREP 26ML APPLICATOR (WOUND CARE) ×6 IMPLANT
GAUZE PACKING IODOFORM 1/2 (PACKING) ×3 IMPLANT
GAUZE PACKING IODOFORM 1X5 (MISCELLANEOUS) ×3 IMPLANT
GLOVE BIOGEL PI IND STRL 6.5 (GLOVE) ×2 IMPLANT
GLOVE BIOGEL PI IND STRL 7.0 (GLOVE) ×2 IMPLANT
GLOVE BIOGEL PI INDICATOR 6.5 (GLOVE) ×4
GLOVE BIOGEL PI INDICATOR 7.0 (GLOVE) ×4
GLOVE ECLIPSE 6.5 STRL STRAW (GLOVE) ×6 IMPLANT
LIGASURE VESSEL 5MM BLUNT TIP (ELECTROSURGICAL) IMPLANT
NEEDLE INSUFFLATION 120MM (ENDOMECHANICALS) ×3 IMPLANT
OCCLUDER COLPOPNEUMO (BALLOONS) ×3 IMPLANT
PACK LAVH (CUSTOM PROCEDURE TRAY) ×3 IMPLANT
PACK ROBOTIC GOWN (GOWN DISPOSABLE) ×3 IMPLANT
PACK TRENDGUARD 450 HYBRID PRO (MISCELLANEOUS) ×1 IMPLANT
PACK TRENDGUARD 600 HYBRD PROC (MISCELLANEOUS) IMPLANT
PAD OB MATERNITY 4.3X12.25 (PERSONAL CARE ITEMS) ×3 IMPLANT
PROTECTOR NERVE ULNAR (MISCELLANEOUS) ×6 IMPLANT
SET IRRIG TUBING LAPAROSCOPIC (IRRIGATION / IRRIGATOR) ×3 IMPLANT
SHEARS HARMONIC ACE PLUS 36CM (ENDOMECHANICALS) ×3 IMPLANT
SLEEVE XCEL OPT CAN 5 100 (ENDOMECHANICALS) ×6 IMPLANT
SUT MON AB 4-0 PS1 27 (SUTURE) ×3 IMPLANT
SUT VIC AB 0 CT1 18XCR BRD8 (SUTURE) ×1 IMPLANT
SUT VIC AB 0 CT1 27 (SUTURE) ×4
SUT VIC AB 0 CT1 27XBRD ANBCTR (SUTURE) ×2 IMPLANT
SUT VIC AB 0 CT1 36 (SUTURE) ×6 IMPLANT
SUT VIC AB 0 CT1 8-18 (SUTURE) ×2
SUT VICRYL 0 UR6 27IN ABS (SUTURE) IMPLANT
TIP UTERINE 5.1X6CM LAV DISP (MISCELLANEOUS) IMPLANT
TIP UTERINE 6.7X10CM GRN DISP (MISCELLANEOUS) IMPLANT
TIP UTERINE 6.7X6CM WHT DISP (MISCELLANEOUS) IMPLANT
TIP UTERINE 6.7X8CM BLUE DISP (MISCELLANEOUS) ×3 IMPLANT
TOWEL OR 17X24 6PK STRL BLUE (TOWEL DISPOSABLE) ×6 IMPLANT
TRAY FOLEY W/BAG SLVR 14FR (SET/KITS/TRAYS/PACK) ×3 IMPLANT
TRENDGUARD 450 HYBRID PRO PACK (MISCELLANEOUS) ×3
TRENDGUARD 600 HYBRID PROC PK (MISCELLANEOUS)
TROCAR XCEL NON-BLD 11X100MML (ENDOMECHANICALS) IMPLANT
TROCAR XCEL NON-BLD 5MMX100MML (ENDOMECHANICALS) ×3 IMPLANT
WARMER LAPAROSCOPE (MISCELLANEOUS) ×3 IMPLANT

## 2018-03-15 NOTE — Anesthesia Postprocedure Evaluation (Signed)
Anesthesia Post Note  Patient: Stacey Flores  Procedure(s) Performed: TOTAL LAPAROSCOPIC HYSTERECTOMY WITH SALPINGECTOMY (Bilateral Abdomen)     Patient location during evaluation: PACU Anesthesia Type: General Level of consciousness: awake and alert Pain management: pain level controlled Vital Signs Assessment: post-procedure vital signs reviewed and stable Respiratory status: spontaneous breathing, nonlabored ventilation, respiratory function stable and patient connected to nasal cannula oxygen Cardiovascular status: blood pressure returned to baseline and stable Postop Assessment: no apparent nausea or vomiting Anesthetic complications: no    Last Vitals:  Vitals:   03/15/18 1225 03/15/18 1319  BP: 134/82 137/73  Pulse: 60 62  Resp: 16 16  Temp: 36.9 C 36.5 C  SpO2:      Last Pain:  Vitals:   03/15/18 1508  TempSrc:   PainSc: 5    Pain Goal: Patients Stated Pain Goal: 4 (03/15/18 1508)               Cristela BlueJACKSON,[name]

## 2018-03-15 NOTE — Transfer of Care (Signed)
Immediate Anesthesia Transfer of Care Note  Patient: Stacey Flores  Procedure(s) Performed: TOTAL LAPAROSCOPIC HYSTERECTOMY WITH SALPINGECTOMY (Bilateral Abdomen)  Patient Location: PACU  Anesthesia Type:General  Level of Consciousness: awake, alert , oriented, drowsy and patient cooperative  Airway & Oxygen Therapy: Patient Spontanous Breathing and Patient connected to nasal cannula oxygen  Post-op Assessment: Report given to RN and Post -op Vital signs reviewed and stable  Post vital signs: Reviewed and stable  Last Vitals:  Vitals Value Taken Time  BP    Temp    Pulse 83 03/15/2018 10:42 AM  Resp 17 03/15/2018 10:42 AM  SpO2 94 % 03/15/2018 10:42 AM  Vitals shown include unvalidated device data.  Last Pain:  Vitals:   03/15/18 0721  TempSrc: Oral  PainSc: 2       Patients Stated Pain Goal: 3 (03/15/18 0721)  Complications: No apparent anesthesia complications

## 2018-03-15 NOTE — Op Note (Signed)
OPERATIVE NOTE  Stacey Flores  DOB:    05/27/1971  MRN:    [id number]  CSN:    [id number]  Date of Surgery:  03/15/2018  Preoperative Diagnosis: Abnormal uterine bleeding, morbid obesity  Postoperative Diagnosis: same  Procedure:  Total laparoscopic hysterectomy, bilateral salpingectomy  Surgeon: Dr. Myna HidalgoJennifer Lollie Gunner  Assistant:  Dr. Gerald Leitzara Cole  Anesthetic:  General  Disposition:  The patient presents with the above-mentioned diagnosis. She understands the indications for surgical procedure.  She also understands the alternative treatment options. She accepts the risk of, but not limited to, anesthetic complications, bleeding, infections, and possible damage to the surrounding organs.  Findings: 8 cm retroverted uterus, normal tubes and ovaries bilaterally  Procedure:  The patient was taken to the operating room where a general anesthetic was given. The patient's  abdomen was prepped with ChloraPrep. The perineum and vagina were prepped with multiple layers of Betadine. A Foley catheter was placed in the bladder. An examination under anesthesia was performed. A Hulka tenaculum was placed inside the uterus. The patient was sterilely draped. The subumbilical area was injected with half percent Marcaine with epinephrine. An incision was made and the Veress needle was inserted into the abdominal cavity without difficulty. Proper placement was confirmed using the saline drop test. A pneumoperitoneum was obtained. The laparoscopic trocar and the laparoscope were substituted for the Veress needle. Operative findings as mentioned above. The mid abdomen was injected with half percent Marcaine with epinephrine to the right of the midline. A small incision was made and a 5 mm trocar was inserted into the abdominal cavity under direct visualization. An identical procedure was carried out on the opposite side. Pictures were taken of the patient's pelvic structures. The left fallopian tube was  resected with the harmonic.  The left round ligament was identified. The round ligament was cauterized and cut. The left utero-ovarian ligament was cauterized and cut. The bladder flap was developed anteriorly. The right round ligament was cauterized and cut using the harmonic. The right fallopian tube and right utero-ovarian ligament were cauterized and cut in a similar procedure.  The uterine artery were skeletonized bilaterally and ligated.  Attention was turned back to the bladder and the vesicouterine flap was further developed, the cuff was palpable.  The harmonic as well as the laparoscopic hook were used to make a circumferential incision around the cuff.  Attention was turned vaginally.  The uterus was removed from the operative field.   Angle stitches were placed.  The cuff was closed in a running locked fashion.  Hemostasis was confirmed.  Gown and gloves were changed and the case proceeded laparoscopically. The pneumoperitoneum was reestablished. The pelvis was inspected and hemostasis was adequate. The pelvis was irrigated. The bilateral fallopian tubes were removed without difficulty.  Arista was placed.  The 5 mm trocars were removed under direct visualization. The pneumoperitoneum was allowed to escape. The subumbilical incision was closed with 4-0 Monocryl. The patient tolerated her procedure well. She was awakened from her anesthetic without difficulty and then transported to the recovery room in stable condition. Sponge, needle, and instrument counts were correct.  The uterus was sent to pathology.  Myna HidalgoJennifer Jachai Okazaki, DO [phone] (cell) [phone] (office)

## 2018-03-15 NOTE — Interval H&P Note (Signed)
History and Physical Interval Note:  03/15/2018 7:19 AM  Stacey NatalEmily N Flores  has presented today for surgery, with the diagnosis of N93.9 AUB  The various methods of treatment have been discussed with the patient and family. After consideration of risks, benefits and other options for treatment, the patient has consented to  Procedure(s): TOTAL LAPAROSCOPIC HYSTERECTOMY WITH SALPINGECTOMY (Bilateral) as a surgical intervention .  The patient's history has been reviewed, patient examined, no change in status, stable for surgery.  I have reviewed the patient's chart and labs.  Questions were answered to the patient's satisfaction.     Sharon SellerJennifer M Finnley Lewis

## 2018-03-15 NOTE — Anesthesia Procedure Notes (Signed)
Procedure Name: Intubation Date/Time: 03/15/2018 8:37 AM Performed by: Raenette Rover, CRNA Pre-anesthesia Checklist: Patient identified, Emergency Drugs available, Suction available and Patient being monitored Patient Re-evaluated:Patient Re-evaluated prior to induction Oxygen Delivery Method: Circle system utilized Preoxygenation: Pre-oxygenation with 100% oxygen Induction Type: IV induction Ventilation: Mask ventilation without difficulty Laryngoscope Size: Mac and 3 Grade View: Grade II Tube type: Oral Tube size: 7.0 mm Number of attempts: 1 Airway Equipment and Method: Stylet Placement Confirmation: positive ETCO2,  CO2 detector and breath sounds checked- equal and bilateral Secured at: 22 cm Tube secured with: Tape Dental Injury: Teeth and Oropharynx as per pre-operative assessment

## 2018-03-16 ENCOUNTER — Encounter (HOSPITAL_COMMUNITY): Payer: Self-pay | Admitting: Obstetrics & Gynecology

## 2018-03-16 DIAGNOSIS — N800 Endometriosis of uterus: Secondary | ICD-10-CM | POA: Diagnosis not present

## 2018-03-16 LAB — BASIC METABOLIC PANEL
ANION GAP: 9 (ref 5–15)
BUN: 9 mg/dL (ref 6–20)
CHLORIDE: 102 mmol/L (ref 101–111)
CO2: 24 mmol/L (ref 22–32)
CREATININE: 0.77 mg/dL (ref 0.44–1.00)
Calcium: 8.1 mg/dL — ABNORMAL LOW (ref 8.9–10.3)
GFR calc non Af Amer: >60 mL/min (ref >60)
Glucose, Bld: 118 mg/dL — ABNORMAL HIGH (ref 65–99)
Potassium: 3.4 mmol/L — ABNORMAL LOW (ref 3.5–5.1)
SODIUM: 135 mmol/L (ref 135–145)

## 2018-03-16 LAB — CBC
HEMATOCRIT: 31.8 % — AB (ref 36.0–46.0)
HEMOGLOBIN: 10.8 g/dL — AB (ref 12.0–15.0)
MCH: 31 pg (ref 26.0–34.0)
MCHC: 34 g/dL (ref 30.0–36.0)
MCV: 91.4 fL (ref 78.0–100.0)
Platelets: 226 10*3/uL (ref 150–400)
RBC: 3.48 MIL/uL — AB (ref 3.87–5.11)
RDW: 13.7 % (ref 11.5–15.5)
WBC: 15 10*3/uL — ABNORMAL HIGH (ref 4.0–10.5)

## 2018-03-16 MED ORDER — IBUPROFEN 600 MG PO TABS: 600.0000 mg | ORAL_TABLET | Freq: Four times a day (QID) | ORAL | Status: DC | PRN

## 2018-03-16 MED ORDER — DOCUSATE SODIUM 100 MG PO CAPS: 100.0000 mg | ORAL_CAPSULE | Freq: Two times a day (BID) | ORAL | 0 refills | Status: DC

## 2018-03-16 MED ORDER — IBUPROFEN 600 MG PO TABS: 600.0000 mg | ORAL_TABLET | Freq: Four times a day (QID) | ORAL | 0 refills | Status: DC | PRN

## 2018-03-16 MED ORDER — OXYCODONE-ACETAMINOPHEN 5-325 MG PO TABS: 1.0000 | ORAL_TABLET | Freq: Four times a day (QID) | ORAL | 0 refills | Status: DC | PRN

## 2018-03-16 NOTE — Progress Notes (Signed)
Discharge instructions given, questions answered. Pt states understanding. Signs and given copy.  

## 2018-03-16 NOTE — Progress Notes (Signed)
Postop Note Day # 1  S:  Patient resting comfortable in bed.  Pain controlled.  Tolerating general diet. Small amount of flatus, no BM. Minimal spotting.  Ambulating without difficulty.  She denies n/v/f/c, SOB, or CP.  Voiding freely this am.  O: Temp:  [97.7 F (36.5 C)-99 F (37.2 C)] 98.2 F (36.8 C) (04/18 0450) Pulse Rate:  [53-83] 66 (04/18 0450) Resp:  [14-20] 20 (04/18 0450) BP: (120-146)/(70-95) 135/75 (04/18 0450) SpO2:  [95 %-100 %] 98 % (04/18 0450) Weight:  [109.8 kg (242 lb)] 109.8 kg (242 lb) (04/17 1229)   Gen: A&Ox3, NAD CV: RRR Resp: CTAB Abdomen: soft, NT, ND BS quiet Trocar incisions: CD/I Ext: No edema, no calf tenderness bilaterally, SCDs in place  Labs: Results for orders placed or performed during the hospital encounter of 03/15/18 (from the past 24 hour(s))  CBC     Status: Abnormal   Collection Time: 03/16/18  6:06 AM  Result Value Ref Range   WBC 15.0 (H) 4.0 - 10.5 K/uL   RBC 3.48 (L) 3.87 - 5.11 MIL/uL   Hemoglobin 10.8 (L) 12.0 - 15.0 g/dL   HCT 16.131.8 (L) 09.636.0 - 04.546.0 %   MCV 91.4 78.0 - 100.0 fL   MCH 31.0 26.0 - 34.0 pg   MCHC 34.0 30.0 - 36.0 g/dL   RDW 40.913.7 81.111.5 - 91.415.5 %   Platelets 226 150 - 400 K/uL  Basic metabolic panel     Status: Abnormal   Collection Time: 03/16/18  6:06 AM  Result Value Ref Range   Sodium 135 135 - 145 mmol/L   Potassium 3.4 (L) 3.5 - 5.1 mmol/L   Chloride 102 101 - 111 mmol/L   CO2 24 22 - 32 mmol/L   Glucose, Bld 118 (H) 65 - 99 mg/dL   BUN 9 6 - 20 mg/dL   Creatinine, Ser 7.820.77 0.44 - 1.00 mg/dL   Calcium 8.1 (L) 8.9 - 10.3 mg/dL   GFR calc non Af Amer >60 >60 mL/min   GFR calc Af Amer >60 >60 mL/min   Anion gap 9 5 - 15     A/P: Pt is a 47 y.o. G0 s/p TLH, BS, POD#1  - Pain well controlled -GU: UOP is adequate and voiding freely -GI: Tolerating general diet -Activity: encouraged sitting up to chair and ambulation as tolerated -Prophylaxis: early ambulation, SCDs while in bed -Labs: stable as  above  Meeting postop milestones appropriately, plan for discharge home today  Myna HidalgoJennifer Jerimyah Vandunk, DO [phone] (pager) [phone] (office)

## 2018-03-16 NOTE — Plan of Care (Signed)
  Problem: Safety: Goal: Ability to remain free from injury will improve Outcome: Progressing   Problem: Activity: Goal: Risk for activity intolerance will decrease Outcome: Progressing   Problem: Elimination: Goal: Will not experience complications related to bowel motility Outcome: Progressing   Problem: Education: Goal: Knowledge of the prescribed therapeutic regimen will improve Outcome: Progressing

## 2018-03-16 NOTE — Discharge Instructions (Addendum)
Total Laparoscopic Hysterectomy, Care After Refer to this sheet in the next few weeks. These instructions provide you with information on caring for yourself after your procedure. Your health care provider may also give you more specific instructions. Your treatment has been planned according to current medical practices, but problems sometimes occur. Call your health care provider if you have any problems or questions after your procedure. What can I expect after the procedure?  Pain and bruising at the incision sites. You will be given pain medicine to control it.    Menopausal symptoms such as hot flashes, night sweats, and insomnia if your ovaries were removed.  Sore throat from the breathing tube that was inserted during surgery. Follow these instructions at home:  Only take over-the-counter or prescription medicines for pain, discomfort, or fever as directed by your health care provider.  Take Ibuprofen every 6-8hr for moderate pain.  In between, if you are still having pain, you may take percocet- 1 tablet every 6 hours if needed.  This medication may cause constipation, be sure to take it with a stool softener if needed.  Do not take aspirin. It can cause bleeding.  Do not drive when taking pain medicine.  Follow your health care provider's advice regarding diet, exercise, lifting, driving, and general activities.  No heavy lifting for the next 6 weeks.  For the first 2 weeks avoid any heavy pulling, lifting or straining.  Resume your usual diet as directed and allowed.  Get plenty of rest and sleep.  Do not douche, use tampons, or have sexual intercourse for at least 6 weeks, or until your health care provider gives you permission.  Change your bandages (dressings) as directed by your health care provider.  Monitor your temperature and notify your health care provider of a fever.  Take showers instead of baths for 2-3 weeks.  Do not drink alcohol until your health care  provider gives you permission.  If you develop constipation, you may take a mild laxative with your health care provider's permission. Bran foods may help with constipation problems. Drinking enough fluids to keep your urine clear or pale yellow may help as well.  Try to have someone home with you for 1-2 weeks to help around the house.  Keep all of your follow-up appointments as directed by your health care provider. Contact a health care provider if:  You have swelling, redness, or increasing pain around your incision sites.  You have pus coming from your incision.  You notice a bad smell coming from your incision.  Your incision breaks open.  You feel dizzy or lightheaded.  You have pain or bleeding when you urinate.  You have persistent diarrhea.  You have persistent nausea and vomiting.  You have abnormal vaginal discharge.  You have a rash.  You have any type of abnormal reaction or develop an allergy to your medicine.  You have poor pain control with your prescribed medicine. Get help right away if:  You have chest pain or shortness of breath.  You have severe abdominal pain that is not relieved with pain medicine.  You have pain or swelling in your legs. This information is not intended to replace advice given to you by your health care provider. Make sure you discuss any questions you have with your health care provider. Document Released: 09/05/2013 Document Revised: 04/22/2016 Document Reviewed: 06/05/2013 Elsevier Interactive Patient Education  2017 [employer]

## 2018-03-16 NOTE — Discharge Summary (Signed)
Physician Discharge Summary  Patient ID: Stacey Flores MRN: [id number] DOB/AGE: 02/13/1971 47 y.o.  Admit date: 03/15/2018 Discharge date: 03/16/2018  Admission Diagnoses: abnormal uterine bleeding  Discharge Diagnoses:  Active Problems:   Abnormal uterine bleeding   Discharged Condition: stable  Hospital Course: 47yo G0 who underwent a total laparoscopic hysterectomy and bilateral salpingectomy. Please see the operative note regarding her procedure.  Her postop course was uncomplicated and she was discharged home in stable condition on POD#1  Consults: None  Significant Diagnostic Studies: labs:  Results for orders placed or performed during the hospital encounter of 03/15/18 (from the past 24 hour(s))  CBC     Status: Abnormal   Collection Time: 03/16/18  6:06 AM  Result Value Ref Range   WBC 15.0 (H) 4.0 - 10.5 K/uL   RBC 3.48 (L) 3.87 - 5.11 MIL/uL   Hemoglobin 10.8 (L) 12.0 - 15.0 g/dL   HCT 40.9 (L) 81.1 - 91.4 %   MCV 91.4 78.0 - 100.0 fL   MCH 31.0 26.0 - 34.0 pg   MCHC 34.0 30.0 - 36.0 g/dL   RDW 78.2 95.6 - 21.3 %   Platelets 226 150 - 400 K/uL  Basic metabolic panel     Status: Abnormal   Collection Time: 03/16/18  6:06 AM  Result Value Ref Range   Sodium 135 135 - 145 mmol/L   Potassium 3.4 (L) 3.5 - 5.1 mmol/L   Chloride 102 101 - 111 mmol/L   CO2 24 22 - 32 mmol/L   Glucose, Bld 118 (H) 65 - 99 mg/dL   BUN 9 6 - 20 mg/dL   Creatinine, Ser 0.86 0.44 - 1.00 mg/dL   Calcium 8.1 (L) 8.9 - 10.3 mg/dL   GFR calc non Af Amer >60 >60 mL/min   GFR calc Af Amer >60 >60 mL/min   Anion gap 9 5 - 15     Treatments: IV hydration, antibiotics: Ancef, analgesia: Dilaudid and toradol and surgery: total laparoscopic hysterectomy and bilateral salpingectomy.  Discharge Exam: Blood pressure 135/75, pulse 66, temperature 98.2 F (36.8 C), temperature source Oral, resp. rate 20, height 5' 4.5" (1.638 m), weight 109.8 kg (242 lb), SpO2 98 %. Gen: A&Ox3, NAD CV:  RRR Resp: CTAB Abdomen: soft, NT, ND BS quiet Trocar incisions: CD/I Ext: No edema, no calf tenderness bilaterally, SCDs in place  Disposition: Discharge disposition: 01-Home or Self Care        Allergies as of 03/16/2018   No Known Allergies     Medication List    STOP taking these medications   naproxen sodium 220 MG tablet Commonly known as:  ALEVE     TAKE these medications   docusate sodium 100 MG capsule Commonly known as:  COLACE Take 1 capsule (100 mg total) by mouth 2 (two) times daily.   fluticasone 50 MCG/ACT nasal spray Commonly known as:  FLONASE Place 1 spray into both nostrils daily.   ibuprofen 600 MG tablet Commonly known as:  ADVIL,MOTRIN Take 1 tablet (600 mg total) by mouth every 6 (six) hours as needed for mild pain or moderate pain.   loratadine 10 MG tablet Commonly known as:  CLARITIN Take 10 mg by mouth at bedtime.   metroNIDAZOLE 0.75 % cream Commonly known as:  METROCREAM Apply 1 application topically daily. Applied to face   multivitamin with minerals Tabs tablet Take 1 tablet by mouth daily. One-A-Day Women's   oxyCODONE-acetaminophen 5-325 MG tablet Commonly known as:  PERCOCET/ROXICET Take 1 tablet  by mouth every 6 (six) hours as needed for moderate pain ((when tolerating fluids)).   valACYclovir 500 MG tablet Commonly known as:  VALTREX Take 500 mg by mouth daily.   ZEGERID OTC 20-1100 MG Caps capsule Generic drug:  Omeprazole-Sodium Bicarbonate Take 1 capsule by mouth daily before breakfast.      Follow-up Information    Myna Hidalgozan, Rikki Smestad, DO Follow up in 2 week(s).   Specialty:  Obstetrics and Gynecology Contact information: [address] [place] [address] [phone]           Signed: Sharon SellerJennifer M Ambree Frances 03/16/2018, 7:10 AM

## 2018-04-28 ENCOUNTER — Other Ambulatory Visit: Payer: Self-pay | Admitting: Physician Assistant

## 2018-04-28 DIAGNOSIS — R1011 Right upper quadrant pain: Secondary | ICD-10-CM

## 2018-05-10 ENCOUNTER — Ambulatory Visit
Admission: RE | Admit: 2018-05-10 | Discharge: 2018-05-10 | Disposition: A | Discharge status: Home / Self Care | Payer: BC Managed Care – PPO | Source: Ambulatory Visit | Attending: Physician Assistant | Admitting: Physician Assistant

## 2018-05-10 DIAGNOSIS — R1011 Right upper quadrant pain: Secondary | ICD-10-CM

## 2018-11-13 ENCOUNTER — Other Ambulatory Visit: Payer: Self-pay | Admitting: Family Medicine

## 2018-11-13 DIAGNOSIS — Z1231 Encounter for screening mammogram for malignant neoplasm of breast: Secondary | ICD-10-CM

## 2019-01-01 ENCOUNTER — Ambulatory Visit
Admission: RE | Admit: 2019-01-01 | Discharge: 2019-01-01 | Disposition: A | Discharge status: Home / Self Care | Payer: BC Managed Care – PPO | Source: Ambulatory Visit | Attending: Family Medicine | Admitting: Family Medicine

## 2019-01-01 DIAGNOSIS — Z1231 Encounter for screening mammogram for malignant neoplasm of breast: Secondary | ICD-10-CM

## 2019-06-12 ENCOUNTER — Other Ambulatory Visit: Payer: Self-pay | Admitting: Family Medicine

## 2019-06-12 DIAGNOSIS — Z1231 Encounter for screening mammogram for malignant neoplasm of breast: Secondary | ICD-10-CM

## 2020-01-08 ENCOUNTER — Ambulatory Visit
Admission: RE | Admit: 2020-01-08 | Discharge: 2020-01-08 | Disposition: A | Discharge status: Home / Self Care | Payer: BC Managed Care – PPO | Source: Ambulatory Visit | Attending: Family Medicine | Admitting: Family Medicine

## 2020-01-08 ENCOUNTER — Other Ambulatory Visit: Payer: Self-pay

## 2020-01-08 DIAGNOSIS — Z1231 Encounter for screening mammogram for malignant neoplasm of breast: Secondary | ICD-10-CM

## 2020-12-03 ENCOUNTER — Other Ambulatory Visit: Payer: Self-pay | Admitting: Family Medicine

## 2020-12-03 DIAGNOSIS — Z1231 Encounter for screening mammogram for malignant neoplasm of breast: Secondary | ICD-10-CM

## 2021-01-13 ENCOUNTER — Other Ambulatory Visit: Payer: Self-pay

## 2021-01-13 ENCOUNTER — Ambulatory Visit: Payer: BC Managed Care – PPO | Admitting: Orthopedic Surgery

## 2021-01-13 ENCOUNTER — Encounter: Payer: Self-pay | Admitting: Orthopedic Surgery

## 2021-01-13 ENCOUNTER — Ambulatory Visit
Admission: RE | Admit: 2021-01-13 | Discharge: 2021-01-13 | Disposition: A | Discharge status: Home / Self Care | Payer: BC Managed Care – PPO | Source: Ambulatory Visit | Attending: Family Medicine | Admitting: Family Medicine

## 2021-01-13 ENCOUNTER — Ambulatory Visit: Payer: Self-pay

## 2021-01-13 DIAGNOSIS — M542 Cervicalgia: Secondary | ICD-10-CM

## 2021-01-13 DIAGNOSIS — M25512 Pain in left shoulder: Secondary | ICD-10-CM

## 2021-01-13 DIAGNOSIS — Z1231 Encounter for screening mammogram for malignant neoplasm of breast: Secondary | ICD-10-CM

## 2021-01-13 MED ORDER — PREDNISONE 10 MG PO TABS: 10.0000 mg | ORAL_TABLET | Freq: Every day | ORAL | 1 refills | Status: DC

## 2021-01-13 NOTE — Progress Notes (Signed)
Office Visit Note   Patient: Stacey Flores           Date of Birth: 1971/10/22           MRN: [id number] Visit Date: 01/13/2021              Requested by: Maurice Small, MD [address] [place],  [place] [address] PCP: Maurice Small, MD  Chief Complaint  Patient presents with  . Left Shoulder - Pain  . Neck - Pain      HPI: Patient is a 50 year old woman who presents complaining of medial scapular border pain she has pain with using her arms extended pain that radiates up into her neck she states she also has pain all over her body and she states she is undergoing a blood test for rheumatoid arthritis.  Assessment & Plan: Visit Diagnoses:  1. Neck pain     Plan: We will start her on a low-dose prednisone recommended heat for the cervical spine recommended exercising she is currently walks her dog twice a day plan to follow-up in the office in 2 weeks  Follow-Up Instructions: No follow-ups on file.   Ortho Exam  Patient is alert, oriented, no adenopathy, well-dressed, normal affect, normal respiratory effort. Examination patient has decreased range of motion of her cervical spine she is tender to palpation of medial scapular border she has full range of motion of both shoulders and no pain with Neer or Hawkins impingement test.  The [facility] joint is nontender to palpation she is tender to palpation over the thoracic outlet and over the medial scapular border.  No focal motor weakness in either upper extremity.  Imaging: XR Cervical Spine 2 or 3 views  Result Date: 01/13/2021 2 view radiographs of the cervical spine shows degenerative arthritic changes with bone spurs and joint space narrowing  XR Shoulder Left  Result Date: 01/13/2021 2 view radiographs of the left shoulder shows a congruent joint no superior migration of the humeral head no degenerative changes of the [facility] joint.  No images are attached to the encounter.  Labs: No results found for:  HGBA1C, ESRSEDRATE, CRP, LABURIC, REPTSTATUS, GRAMSTAIN, CULT, LABORGA   Lab Results  Component Value Date   ALBUMIN 3.7 03/13/2018    No results found for: MG No results found for: VD25OH  No results found for: PREALBUMIN CBC EXTENDED Latest Ref Rng & Units 03/16/2018 03/13/2018  WBC 4.0 - 10.5 K/uL 15.0(H) 8.1  RBC 3.87 - 5.11 MIL/uL 3.48(L) 4.25  HGB 12.0 - 15.0 g/dL 10.8(L) 13.0  HCT 36.0 - 46.0 % 31.8(L) 38.9  PLT 150 - 400 K/uL 226 248     There is no height or weight on file to calculate BMI.  Orders:  Orders Placed This Encounter  Procedures  . XR Cervical Spine 2 or 3 views  . XR Shoulder Left   No orders of the defined types were placed in this encounter.    Procedures: No procedures performed  Clinical Data: No additional findings.  ROS:  All other systems negative, except as noted in the HPI. Review of Systems  Objective: Vital Signs: LMP  (LMP Unknown) Comment: Cont.bleeding  Specialty Comments:  No specialty comments available.  PMFS History: Patient Active Problem List   Diagnosis Date Noted  . Abnormal uterine bleeding 03/15/2018   Past Medical History:  Diagnosis Date  . GERD (gastroesophageal reflux disease)   . HSV infection   . Seasonal allergies  Family History  Problem Relation Age of Onset  . Breast cancer Mother     Past Surgical History:  Procedure Laterality Date  . BREAST BIOPSY Left    benign  . BREAST EXCISIONAL BIOPSY    . COLONOSCOPY    . TOTAL LAPAROSCOPIC HYSTERECTOMY WITH SALPINGECTOMY Bilateral 03/15/2018   Procedure: TOTAL LAPAROSCOPIC HYSTERECTOMY WITH SALPINGECTOMY;  Surgeon: Myna Hidalgo, DO;  Location: [facility];  Service: Gynecology;  Laterality: Bilateral;  . TUBAL LIGATION    . UPPER GI ENDOSCOPY    . WISDOM TOOTH EXTRACTION     Social History   Occupational History  . Not on file  Tobacco Use  . Smoking status: Never Smoker  . Smokeless tobacco: Never Used  Vaping Use  . Vaping Use: Never  used  Substance and Sexual Activity  . Alcohol use: Yes    Comment: occaional   . Drug use: Never  . Sexual activity: Yes    Birth control/protection: Surgical

## 2021-02-05 ENCOUNTER — Ambulatory Visit: Payer: BC Managed Care – PPO | Admitting: Orthopedic Surgery

## 2021-02-05 ENCOUNTER — Encounter: Payer: Self-pay | Admitting: Orthopedic Surgery

## 2021-02-05 DIAGNOSIS — M542 Cervicalgia: Secondary | ICD-10-CM | POA: Diagnosis not present

## 2021-02-05 MED ORDER — PREDNISONE 10 MG PO TABS: 10.0000 mg | ORAL_TABLET | Freq: Every day | ORAL | 1 refills | Status: DC

## 2021-02-05 MED ORDER — GABAPENTIN 300 MG PO CAPS: 300.0000 mg | ORAL_CAPSULE | Freq: Every day | ORAL | 0 refills | Status: DC

## 2021-02-05 MED ORDER — DULOXETINE HCL 30 MG PO CPEP: 30.0000 mg | ORAL_CAPSULE | Freq: Every day | ORAL | 0 refills | Status: DC

## 2021-02-05 NOTE — Progress Notes (Signed)
Office Visit Note   Patient: Stacey Flores           Date of Birth: September 23, 1971           MRN: [id number] Visit Date: 02/05/2021              Requested by: Maurice Small, MD [address] [place],  [place] [address] PCP: Maurice Small, MD  Chief Complaint  Patient presents with  . Neck - Pain      HPI: Patient is a 50 year old woman who presents in follow-up for left-sided neck pain and medial scapular border pain on the left.  Patient denies any pain with range of motion of her shoulder she has undergone a rheumatologic screen that was negative.  She states the 10 mg of prednisone has helped and when she tries to decrease this to 5 mg the pain is more severe.  Assessment & Plan: Visit Diagnoses:  1. Neck pain     Plan: Recommend continue with the 10 mg of prednisone recommended a trial of Cymbalta in the morning and Neurontin at night to see if this would help resolve the neuropathic pain.  Reevaluate in 2 weeks.  Follow-Up Instructions: Return in about 2 weeks (around 02/19/2021).   Ortho Exam  Patient is alert, oriented, no adenopathy, well-dressed, normal affect, normal respiratory effort. Examination patient has no focal motor weakness in either upper extremity she has full range of motion of the left shoulder she has tenderness to palpation along the medial scapular border with areas of point tenderness.  She has pain along the cervical muscles no thoracic outlet tenderness.  Imaging: No results found. No images are attached to the encounter.  Labs: No results found for: HGBA1C, ESRSEDRATE, CRP, LABURIC, REPTSTATUS, GRAMSTAIN, CULT, LABORGA   Lab Results  Component Value Date   ALBUMIN 3.7 03/13/2018    No results found for: MG No results found for: VD25OH  No results found for: PREALBUMIN CBC EXTENDED Latest Ref Rng & Units 03/16/2018 03/13/2018  WBC 4.0 - 10.5 K/uL 15.0(H) 8.1  RBC 3.87 - 5.11 MIL/uL 3.48(L) 4.25  HGB 12.0 - 15.0 g/dL  10.8(L) 13.0  HCT 36.0 - 46.0 % 31.8(L) 38.9  PLT 150 - 400 K/uL 226 248     There is no height or weight on file to calculate BMI.  Orders:  No orders of the defined types were placed in this encounter.  Meds ordered this encounter  Medications  . predniSONE (DELTASONE) 10 MG tablet    Sig: Take 1 tablet (10 mg total) by mouth daily with breakfast.    Dispense:  30 tablet    Refill:  1  . DULoxetine (CYMBALTA) 30 MG capsule    Sig: Take 1 capsule (30 mg total) by mouth daily. Qam    Dispense:  30 capsule    Refill:  0  . gabapentin (NEURONTIN) 300 MG capsule    Sig: Take 1 capsule (300 mg total) by mouth at bedtime. 3 times a day when necessary neuropathy pain    Dispense:  30 capsule    Refill:  0     Procedures: No procedures performed  Clinical Data: No additional findings.  ROS:  All other systems negative, except as noted in the HPI. Review of Systems  Objective: Vital Signs: LMP  (LMP Unknown) Comment: Cont.bleeding  Specialty Comments:  No specialty comments available.  PMFS History: Patient Active Problem List   Diagnosis Date Noted  . Abnormal uterine  bleeding 03/15/2018   Past Medical History:  Diagnosis Date  . GERD (gastroesophageal reflux disease)   . HSV infection   . Seasonal allergies     Family History  Problem Relation Age of Onset  . Breast cancer Mother     Past Surgical History:  Procedure Laterality Date  . BREAST BIOPSY Left    benign  . BREAST EXCISIONAL BIOPSY    . COLONOSCOPY    . TOTAL LAPAROSCOPIC HYSTERECTOMY WITH SALPINGECTOMY Bilateral 03/15/2018   Procedure: TOTAL LAPAROSCOPIC HYSTERECTOMY WITH SALPINGECTOMY;  Surgeon: Myna Hidalgo, DO;  Location: [facility];  Service: Gynecology;  Laterality: Bilateral;  . TUBAL LIGATION    . UPPER GI ENDOSCOPY    . WISDOM TOOTH EXTRACTION     Social History   Occupational History  . Not on file  Tobacco Use  . Smoking status: Never Smoker  . Smokeless tobacco: Never Used   Vaping Use  . Vaping Use: Never used  Substance and Sexual Activity  . Alcohol use: Yes    Comment: occaional   . Drug use: Never  . Sexual activity: Yes    Birth control/protection: Surgical

## 2021-02-06 ENCOUNTER — Telehealth: Payer: Self-pay | Admitting: Orthopedic Surgery

## 2021-02-06 NOTE — Telephone Encounter (Signed)
I called and lm on vm for the pharm to advise that it was written to take QHS and could increase to TID if needed for neuropathy pain. To call with any questions.

## 2021-02-06 NOTE — Telephone Encounter (Signed)
[facility] called stating on the gabapentin that was called in it states 1 @ bedtime and 3x a day; the pharmacy would like to know what Dr. Lajoyce Corners means by that. Is it 3x a day and the 1 @ bed time is included? Or is it 3x a day if necessary but 1 @ bedtime regardless? Pharmacy would like a CB  [phone]

## 2021-02-19 ENCOUNTER — Encounter: Payer: Self-pay | Admitting: Orthopedic Surgery

## 2021-02-19 ENCOUNTER — Ambulatory Visit: Payer: BC Managed Care – PPO | Admitting: Physician Assistant

## 2021-02-19 DIAGNOSIS — M542 Cervicalgia: Secondary | ICD-10-CM | POA: Diagnosis not present

## 2021-02-19 NOTE — Progress Notes (Signed)
Office Visit Note   Patient: Stacey Flores           Date of Birth: 10/22/1971           MRN: [id number] Visit Date: 02/19/2021              Requested by: Maurice Small, MD [address] [place],  [place] [address] PCP: Maurice Small, MD  Chief Complaint  Patient presents with  . Neck - Pain      HPI: Patient presents today for follow-up for her neck pain.  She does say she is doing better.  She has been down to 5 mg of prednisone.  She is also taking Cymbalta.  The Cymbalta does make her quite sleepy so she has not been taking the gabapentin overall she feels she is functioning much better  Assessment & Plan: Visit Diagnoses: No diagnosis found.  Plan: She will try weaning off the prednisone in the next week or so.  She is on very small dose.  She will continue with the Cymbalta.  If she feels as though she would like to go forward with an MRI of her neck with the idea of ESI she may call the office and I will order this.  Alternatively I did also offer her physical therapy for scapular stabilization.  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  Follow-Up Instructions: No follow-ups on file.   Ortho Exam  Patient is alert, oriented, no adenopathy, well-dressed, normal affect, normal respiratory effort. She is much less symptomatic with exam than previous exam.  No radicular symptoms noted.  Good flexion extension and sidebending strength is intact  Imaging: No results found. No images are attached to the encounter.  Labs: No results found for: HGBA1C, ESRSEDRATE, CRP, LABURIC, REPTSTATUS, GRAMSTAIN, CULT, LABORGA   Lab Results  Component Value Date   ALBUMIN 3.7 03/13/2018    No results found for: MG No results found for: VD25OH  No results found for: PREALBUMIN CBC EXTENDED Latest Ref Rng & Units 03/16/2018 03/13/2018  WBC 4.0 - 10.5 K/uL 15.0(H) 8.1  RBC 3.87 - 5.11 MIL/uL 3.48(L) 4.25  HGB 12.0 -  15.0 g/dL 10.8(L) 13.0  HCT 36.0 - 46.0 % 31.8(L) 38.9  PLT 150 - 400 K/uL 226 248     There is no height or weight on file to calculate BMI.  Orders:  No orders of the defined types were placed in this encounter.  No orders of the defined types were placed in this encounter.    Procedures: No procedures performed  Clinical Data: No additional findings.  ROS:  All other systems negative, except as noted in the HPI. Review of Systems  Objective: Vital Signs: LMP  (LMP Unknown) Comment: Cont.bleeding  Specialty Comments:  No specialty comments available.  PMFS History: Patient Active Problem List   Diagnosis Date Noted  . Abnormal uterine bleeding 03/15/2018   Past Medical History:  Diagnosis Date  . GERD (gastroesophageal reflux disease)   . HSV infection   . Seasonal allergies     Family History  Problem Relation Age of Onset  . Breast cancer Mother     Past Surgical History:  Procedure Laterality Date  . BREAST BIOPSY Left    benign  . BREAST EXCISIONAL BIOPSY    . COLONOSCOPY    . TOTAL LAPAROSCOPIC HYSTERECTOMY WITH SALPINGECTOMY Bilateral 03/15/2018   Procedure: TOTAL LAPAROSCOPIC HYSTERECTOMY WITH SALPINGECTOMY;  Surgeon: Myna Hidalgo, DO;  Location: [facility];  Service: Gynecology;  Laterality: Bilateral;  . TUBAL LIGATION    . UPPER GI ENDOSCOPY    . WISDOM TOOTH EXTRACTION     Social History   Occupational History  . Not on file  Tobacco Use  . Smoking status: Never Smoker  . Smokeless tobacco: Never Used  Vaping Use  . Vaping Use: Never used  Substance and Sexual Activity  . Alcohol use: Yes    Comment: occaional   . Drug use: Never  . Sexual activity: Yes    Birth control/protection: Surgical

## 2021-03-04 ENCOUNTER — Other Ambulatory Visit: Payer: Self-pay | Admitting: Physician Assistant

## 2021-03-04 ENCOUNTER — Other Ambulatory Visit: Payer: Self-pay | Admitting: Orthopedic Surgery

## 2021-03-04 ENCOUNTER — Telehealth: Payer: Self-pay | Admitting: Orthopedic Surgery

## 2021-03-04 DIAGNOSIS — M542 Cervicalgia: Secondary | ICD-10-CM

## 2021-03-04 NOTE — Telephone Encounter (Signed)
Order in for MRI c spine pt is aware. Can you make follow up with Dr. Alvester Morin?

## 2021-03-04 NOTE — Telephone Encounter (Signed)
noted 

## 2021-03-04 NOTE — Telephone Encounter (Signed)
I do not see that this pt had an MRI and do not know if that would be required before a referral to Dr. Alvester Morin is made. Please see message below and advise.

## 2021-03-04 NOTE — Telephone Encounter (Signed)
Patient called. She would like a referral to see Dr. Alvester Morin. Her call back number is [phone]

## 2021-03-04 NOTE — Telephone Encounter (Signed)
I placed order for mri of c spine.

## 2021-03-11 ENCOUNTER — Telehealth: Payer: Self-pay | Admitting: Orthopedic Surgery

## 2021-03-11 NOTE — Telephone Encounter (Signed)
Called pt twice and LM

## 2021-03-12 ENCOUNTER — Ambulatory Visit
Admission: RE | Admit: 2021-03-12 | Discharge: 2021-03-12 | Disposition: A | Discharge status: Home / Self Care | Payer: BC Managed Care – PPO | Source: Ambulatory Visit | Attending: Physician Assistant | Admitting: Physician Assistant

## 2021-03-12 ENCOUNTER — Other Ambulatory Visit: Payer: Self-pay

## 2021-03-12 DIAGNOSIS — M542 Cervicalgia: Secondary | ICD-10-CM

## 2021-03-16 ENCOUNTER — Ambulatory Visit: Payer: BC Managed Care – PPO | Admitting: Physician Assistant

## 2021-03-16 ENCOUNTER — Encounter: Payer: Self-pay | Admitting: Orthopedic Surgery

## 2021-03-16 DIAGNOSIS — M542 Cervicalgia: Secondary | ICD-10-CM | POA: Diagnosis not present

## 2021-03-16 NOTE — Progress Notes (Signed)
Office Visit Note   Patient: Stacey Flores           Date of Birth: 01-29-1971           MRN: [id number] Visit Date: 03/16/2021              Requested by: Maurice Small, MD [address] [place],  [place] [address] PCP: Maurice Small, MD  Chief Complaint  Patient presents with  . Neck - Follow-up     C spine MRI review       HPI: Patient is here to follow-up on her MRI of her cervical spine.  She has a history of neck pain and medial scapular left-sided pain.  She has been on Cymbalta and prednisone which have helped her somewhat.  She says she feels a little bit better today because she has not been working because school is on spring break  Assessment & Plan: Visit Diagnoses: No diagnosis found.  Plan: Follow-up as needed I will place a referral for ESI.  Follow-Up Instructions: No follow-ups on file.   Ortho Exam  Patient is alert, oriented, no adenopathy, well-dressed, normal affect, normal respiratory effort. Examination demonstrates no change in her previous exam no focal motor weaknesses no paresthesias.  MRI reviewed.  Showed some small disc protrusions in the cervical spine as well as some mild stenosis.  Imaging: No results found. No images are attached to the encounter.  Labs: No results found for: HGBA1C, ESRSEDRATE, CRP, LABURIC, REPTSTATUS, GRAMSTAIN, CULT, LABORGA   Lab Results  Component Value Date   ALBUMIN 3.7 03/13/2018    No results found for: MG No results found for: VD25OH  No results found for: PREALBUMIN CBC EXTENDED Latest Ref Rng & Units 03/16/2018 03/13/2018  WBC 4.0 - 10.5 K/uL 15.0(H) 8.1  RBC 3.87 - 5.11 MIL/uL 3.48(L) 4.25  HGB 12.0 - 15.0 g/dL 10.8(L) 13.0  HCT 36.0 - 46.0 % 31.8(L) 38.9  PLT 150 - 400 K/uL 226 248     There is no height or weight on file to calculate BMI.  Orders:  No orders of the defined types were placed in this encounter.  No orders of the defined types were placed in this  encounter.    Procedures: No procedures performed  Clinical Data: No additional findings.  ROS:  All other systems negative, except as noted in the HPI. Review of Systems  Objective: Vital Signs: LMP  (LMP Unknown) Comment: Cont.bleeding  Specialty Comments:  No specialty comments available.  PMFS History: Patient Active Problem List   Diagnosis Date Noted  . Abnormal uterine bleeding 03/15/2018   Past Medical History:  Diagnosis Date  . GERD (gastroesophageal reflux disease)   . HSV infection   . Seasonal allergies     Family History  Problem Relation Age of Onset  . Breast cancer Mother     Past Surgical History:  Procedure Laterality Date  . BREAST BIOPSY Left    benign  . BREAST EXCISIONAL BIOPSY    . COLONOSCOPY    . TOTAL LAPAROSCOPIC HYSTERECTOMY WITH SALPINGECTOMY Bilateral 03/15/2018   Procedure: TOTAL LAPAROSCOPIC HYSTERECTOMY WITH SALPINGECTOMY;  Surgeon: Myna Hidalgo, DO;  Location: [facility];  Service: Gynecology;  Laterality: Bilateral;  . TUBAL LIGATION    . UPPER GI ENDOSCOPY    . WISDOM TOOTH EXTRACTION     Social History   Occupational History  . Not on file  Tobacco Use  . Smoking status: Never Smoker  . Smokeless  tobacco: Never Used  Vaping Use  . Vaping Use: Never used  Substance and Sexual Activity  . Alcohol use: Yes    Comment: occaional   . Drug use: Never  . Sexual activity: Yes    Birth control/protection: Surgical

## 2021-03-17 ENCOUNTER — Ambulatory Visit: Payer: BC Managed Care – PPO | Admitting: Orthopedic Surgery

## 2021-03-27 ENCOUNTER — Telehealth: Payer: Self-pay | Admitting: Physical Medicine and Rehabilitation

## 2021-03-27 NOTE — Telephone Encounter (Signed)
Called pt and sch 5/17 

## 2021-03-27 NOTE — Telephone Encounter (Signed)
Pt called stating she has a referral and asked for a CB to set her appt but she asked she not be called until after 2 pm today.  [phone]

## 2021-03-29 ENCOUNTER — Other Ambulatory Visit: Payer: Self-pay | Admitting: Orthopedic Surgery

## 2021-04-14 ENCOUNTER — Other Ambulatory Visit: Payer: Self-pay

## 2021-04-14 ENCOUNTER — Ambulatory Visit: Payer: Self-pay

## 2021-04-14 ENCOUNTER — Encounter: Payer: Self-pay | Admitting: Physical Medicine and Rehabilitation

## 2021-04-14 ENCOUNTER — Ambulatory Visit: Payer: BC Managed Care – PPO | Admitting: Physical Medicine and Rehabilitation

## 2021-04-14 VITALS — BP 116/82 | HR 80

## 2021-04-14 DIAGNOSIS — M5412 Radiculopathy, cervical region: Secondary | ICD-10-CM

## 2021-04-14 MED ORDER — BETAMETHASONE SOD PHOS & ACET 6 (3-3) MG/ML IJ SUSP
12.0000 mg | Freq: Once | INTRAMUSCULAR | Status: AC
  Administered 2021-04-14: 12 mg

## 2021-04-14 NOTE — Procedures (Signed)
Cervical Epidural Steroid Injection - Interlaminar Approach with Fluoroscopic Guidance  Patient: Stacey Flores      Date of Birth: Oct 03, 1971 MRN: [id number] PCP: Maurice Small, MD      Visit Date: 04/14/2021   Universal Protocol:    Date/Time: 05/17/221:28 PM  Consent Given By: the patient  Position: PRONE  Additional Comments: Vital signs were monitored before and after the procedure. Patient was prepped and draped in the usual sterile fashion. The correct patient, procedure, and site was verified.   Injection Procedure Details:   Procedure diagnoses: Cervical radiculopathy [M54.12]    Meds Administered:  Meds ordered this encounter  Medications  . betamethasone acetate-betamethasone sodium phosphate (CELESTONE) injection 12 mg     Laterality: Left  Location/Site: C7-T1  Needle: 3.5 in., 20 ga. Tuohy  Needle Placement: Paramedian epidural space  Findings:  -Comments: Excellent flow of contrast into the epidural space.  Procedure Details: Using a paramedian approach from the side mentioned above, the region overlying the inferior lamina was localized under fluoroscopic visualization and the soft tissues overlying this structure were infiltrated with 4 ml. of 1% Lidocaine without Epinephrine. A # 20 gauge, Tuohy needle was inserted into the epidural space using a paramedian approach.  The epidural space was localized using loss of resistance along with contralateral oblique bi-planar fluoroscopic views.  After negative aspirate for air, blood, and CSF, a 2 ml. volume of Isovue-250 was injected into the epidural space and the flow of contrast was observed. Radiographs were obtained for documentation purposes.   The injectate was administered into the level noted above.  Additional Comments:  The patient tolerated the procedure well Dressing: 2 x 2 sterile gauze and Band-Aid    Post-procedure details: Patient was observed during the procedure. Post-procedure  instructions were reviewed.  Patient left the clinic in stable condition.

## 2021-04-14 NOTE — Progress Notes (Signed)
Stacey Flores - 50 y.o. female MRN [id number]  Date of birth: 1971-11-08  Office Visit Note: Visit Date: 04/14/2021 PCP: Maurice Small, MD Referred by: Maurice Small, MD  Subjective: Chief Complaint  Patient presents with  . Neck - Pain  . Right Shoulder - Pain  . Left Shoulder - Pain  . Left Hand - Pain  . Right Hand - Pain   HPI:  Stacey Flores is a 49 y.o. female who comes in today at the request of [place] Person, PA-C and Dr. Aldean Baker  for planned Left C7-T1 Cervical Interlaminar epidural steroid injection with fluoroscopic guidance.  The patient has failed conservative care including home exercise, medications, time and activity modification.  This injection will be diagnostic and hopefully therapeutic.  Please see requesting physician notes for further details and justification. MRI reviewed with images and spine model.  MRI reviewed in the note below.    ROS Otherwise per HPI.  Assessment & Plan: Visit Diagnoses:    ICD-10-CM   1. Cervical radiculopathy  M54.12 XR C-ARM NO REPORT    Epidural Steroid injection    betamethasone acetate-betamethasone sodium phosphate (CELESTONE) injection 12 mg    Plan: No additional findings.   Meds & Orders:  Meds ordered this encounter  Medications  . betamethasone acetate-betamethasone sodium phosphate (CELESTONE) injection 12 mg    Orders Placed This Encounter  Procedures  . XR C-ARM NO REPORT  . Epidural Steroid injection    Follow-up: Return if symptoms worsen or fail to improve.   Procedures: No procedures performed  Cervical Epidural Steroid Injection - Interlaminar Approach with Fluoroscopic Guidance  Patient: Stacey Flores      Date of Birth: 1971-08-29 MRN: [id number] PCP: Maurice Small, MD      Visit Date: 04/14/2021   Universal Protocol:    Date/Time: 05/17/221:28 PM  Consent Given By: the patient  Position: PRONE  Additional Comments: Vital signs were monitored before and after the  procedure. Patient was prepped and draped in the usual sterile fashion. The correct patient, procedure, and site was verified.   Injection Procedure Details:   Procedure diagnoses: Cervical radiculopathy [M54.12]    Meds Administered:  Meds ordered this encounter  Medications  . betamethasone acetate-betamethasone sodium phosphate (CELESTONE) injection 12 mg     Laterality: Left  Location/Site: C7-T1  Needle: 3.5 in., 20 ga. Tuohy  Needle Placement: Paramedian epidural space  Findings:  -Comments: Excellent flow of contrast into the epidural space.  Procedure Details: Using a paramedian approach from the side mentioned above, the region overlying the inferior lamina was localized under fluoroscopic visualization and the soft tissues overlying this structure were infiltrated with 4 ml. of 1% Lidocaine without Epinephrine. A # 20 gauge, Tuohy needle was inserted into the epidural space using a paramedian approach.  The epidural space was localized using loss of resistance along with contralateral oblique bi-planar fluoroscopic views.  After negative aspirate for air, blood, and CSF, a 2 ml. volume of Isovue-250 was injected into the epidural space and the flow of contrast was observed. Radiographs were obtained for documentation purposes.   The injectate was administered into the level noted above.  Additional Comments:  The patient tolerated the procedure well Dressing: 2 x 2 sterile gauze and Band-Aid    Post-procedure details: Patient was observed during the procedure. Post-procedure instructions were reviewed.  Patient left the clinic in stable condition.     Clinical History: CLINICAL DATA:  Left-sided neck and shoulder pain  since January.  EXAM: MRI CERVICAL SPINE WITHOUT CONTRAST  TECHNIQUE: Multiplanar, multisequence MR imaging of the cervical spine was performed. No intravenous contrast was administered.  COMPARISON:  Cervical spine radiographs  01/13/2021  FINDINGS: Alignment: Straightening/slight reversal of the normal cervical lordosis. No significant listhesis.  Vertebrae: No fracture, suspicious osseous lesion, or significant marrow edema.  Cord: Normal signal.  Posterior Fossa, vertebral arteries, paraspinal tissues: Unremarkable.  Disc levels:  C2-3: Negative.  C3-4: Tiny central disc protrusion without stenosis.  C4-5: Mild disc space narrowing. Mild disc bulging without stenosis.  C5-6: Mild disc space narrowing. Mild disc bulging, a small left paracentral disc protrusion, and mild uncovertebral spurring result in mild spinal stenosis with slight left ventral cord flattening and mild right neural foraminal stenosis. Patent left neural foramen.  C6-7: Small right paracentral disc protrusion without stenosis or spinal cord mass effect.  C7-T1: Negative.  IMPRESSION: 1. Mild cervical disc degeneration, most notable at C5-6 where there is mild spinal stenosis. 2. Small right paracentral disc protrusion at C6-7 without stenosis.   Electronically Signed   By: Sebastian Ache M.D.   On: 03/13/2021 17:18     Objective:  VS:  HT:    WT:   BMI:     BP:116/82  HR:80bpm  TEMP: ( )  RESP:  Physical Exam Vitals and nursing note reviewed.  Constitutional:      General: She is not in acute distress.    Appearance: Normal appearance. She is not ill-appearing.  HENT:     Head: Normocephalic and atraumatic.     Right Ear: External ear normal.     Left Ear: External ear normal.  Eyes:     Extraocular Movements: Extraocular movements intact.  Cardiovascular:     Rate and Rhythm: Normal rate.     Pulses: Normal pulses.  Musculoskeletal:     Cervical back: Tenderness present. No rigidity.     Right lower leg: No edema.     Left lower leg: No edema.     Comments: Patient has good strength in the upper extremities including 5 out of 5 strength in wrist extension long finger flexion and APB.   There is no atrophy of the hands intrinsically.  There is a negative Hoffmann's test.   Lymphadenopathy:     Cervical: No cervical adenopathy.  Skin:    Findings: No erythema, lesion or rash.  Neurological:     General: No focal deficit present.     Mental Status: She is alert and oriented to person, place, and time.     Sensory: No sensory deficit.     Motor: No weakness or abnormal muscle tone.     Coordination: Coordination normal.  Psychiatric:        Mood and Affect: Mood normal.        Behavior: Behavior normal.      Imaging: No results found.

## 2021-04-14 NOTE — Progress Notes (Signed)
Pt state neck pain that travels to both shoulder and down to her hands.. Pt state turning her head while driving and doing thing in front of her makes the pain worse. Pt state she uses heat and pain meds help ease the pain.  Numeric Pain Rating Scale and Functional Assessment Average Pain 6   In the last MONTH (on 0-10 scale) has pain interfered with the following?  1. General activity like being  able to carry out your everyday physical activities such as walking, climbing stairs, carrying groceries, or moving a chair?  Rating(10)   +Driver, -BT, -Dye Allergies.

## 2021-04-14 NOTE — Patient Instructions (Signed)

## 2021-04-26 ENCOUNTER — Other Ambulatory Visit: Payer: Self-pay | Admitting: Physician Assistant

## 2021-04-28 ENCOUNTER — Other Ambulatory Visit: Payer: Self-pay | Admitting: Physician Assistant

## 2021-05-04 ENCOUNTER — Other Ambulatory Visit: Payer: Self-pay | Admitting: Physician Assistant

## 2021-05-04 ENCOUNTER — Telehealth: Payer: Self-pay

## 2021-05-04 MED ORDER — DULOXETINE HCL 30 MG PO CPEP: 30.0000 mg | ORAL_CAPSULE | Freq: Every morning | ORAL | 0 refills | Status: DC

## 2021-05-04 NOTE — Telephone Encounter (Signed)
Patient called she is requesting rx refill for Duloxetine, patient is also requesting instructions on how to withdraw rx if the rx cannot be refilled she is requesting a call back:[phone]

## 2021-05-04 NOTE — Telephone Encounter (Signed)
Please see message below and advise.

## 2021-05-04 NOTE — Telephone Encounter (Signed)
done

## 2021-06-02 ENCOUNTER — Other Ambulatory Visit: Payer: Self-pay | Admitting: Physician Assistant

## 2021-06-25 ENCOUNTER — Other Ambulatory Visit: Payer: Self-pay | Admitting: Physician Assistant

## 2021-07-06 ENCOUNTER — Telehealth: Payer: Self-pay

## 2021-07-06 NOTE — Telephone Encounter (Signed)
Pt would like a refill on the Duloxetine.   Please advise

## 2021-07-06 NOTE — Telephone Encounter (Signed)
Please see below and advise.

## 2021-07-07 ENCOUNTER — Other Ambulatory Visit: Payer: Self-pay | Admitting: Physician Assistant

## 2021-07-07 MED ORDER — DULOXETINE HCL 30 MG PO CPEP: 30.0000 mg | ORAL_CAPSULE | Freq: Every morning | ORAL | 0 refills | Status: DC

## 2021-07-07 NOTE — Telephone Encounter (Signed)
Patient aware this has been sent in for her

## 2021-07-07 NOTE — Telephone Encounter (Signed)
done

## 2021-07-07 NOTE — Telephone Encounter (Signed)
Pt would like a refill on the Duloxetine. Pt stated she has no more medication and needs it ASAP as she does not want to cold [place] stop the medication. Pt worried there will be side effects of stopping cold [place] but did not know. The best pharmacy is the one on file and the best call back number is [phone].

## 2021-07-30 ENCOUNTER — Other Ambulatory Visit: Payer: Self-pay | Admitting: Physician Assistant

## 2021-12-04 ENCOUNTER — Other Ambulatory Visit: Payer: Self-pay | Admitting: Family Medicine

## 2021-12-04 DIAGNOSIS — Z1231 Encounter for screening mammogram for malignant neoplasm of breast: Secondary | ICD-10-CM

## 2022-01-13 ENCOUNTER — Ambulatory Visit
Admission: RE | Admit: 2022-01-13 | Discharge: 2022-01-13 | Disposition: A | Discharge status: Home / Self Care | Payer: BC Managed Care – PPO | Source: Ambulatory Visit | Attending: Family Medicine | Admitting: Family Medicine

## 2022-01-13 DIAGNOSIS — Z1231 Encounter for screening mammogram for malignant neoplasm of breast: Secondary | ICD-10-CM

## 2022-02-02 ENCOUNTER — Telehealth: Payer: Self-pay | Admitting: Physical Medicine and Rehabilitation

## 2022-02-02 NOTE — Telephone Encounter (Signed)
Pt called requesting an injection appt. Please call pt at [phone]. ?

## 2022-03-01 ENCOUNTER — Encounter: Payer: Self-pay | Admitting: Physical Medicine and Rehabilitation

## 2022-03-01 ENCOUNTER — Ambulatory Visit: Payer: Self-pay

## 2022-03-01 ENCOUNTER — Ambulatory Visit: Payer: BC Managed Care – PPO | Admitting: Physical Medicine and Rehabilitation

## 2022-03-01 VITALS — BP 133/83 | HR 74

## 2022-03-01 DIAGNOSIS — M5412 Radiculopathy, cervical region: Secondary | ICD-10-CM | POA: Diagnosis not present

## 2022-03-01 MED ORDER — METHYLPREDNISOLONE ACETATE 80 MG/ML IJ SUSP
80.0000 mg | Freq: Once | INTRAMUSCULAR | Status: AC
  Administered 2022-03-01: 80 mg

## 2022-03-01 NOTE — Progress Notes (Signed)
Pt state neck pain that travels to her left shoulder. Pt state driving and turning her her makes the pain worse, Pt state she takes pain meds to hep ease her pain. ? ?Numeric Pain Rating Scale and Functional Assessment ?Average Pain 8 ? ? ?In the last MONTH (on 0-10 scale) has pain interfered with the following? ? ?1. General activity like being  able to carry out your everyday physical activities such as walking, climbing stairs, carrying groceries, or moving a chair?  ?Rating(10) ? ? ?+Driver, -BT, -Dye Allergies. ? ?

## 2022-03-01 NOTE — Progress Notes (Signed)
? ?[name] - 51 y.o. female MRN [id number]  Date of birth: [birth year] ? ?Office Visit Note: ?Visit Date: 03/01/2022 ?PCP: Maurice Small, MD ?Referred by: Maurice Small, MD ? ?Subjective: ?Chief Complaint  ?Patient presents with  ? Neck - Pain  ? Left Shoulder - Pain  ? ?HPI:  Stacey Flores is a 51 y.o. female who comes in today for planned repeat Left C7-T1  Cervical Interlaminar epidural steroid injection with fluoroscopic guidance.  The patient has failed conservative care including home exercise, medications, time and activity modification.  This injection will be diagnostic and hopefully therapeutic.  Please see requesting physician notes for further details and justification. Patient received more than 50% pain relief from prior injection and last injection was in May 2022. MRI reviewed with images and spine model.  MRI reviewed in the note below. ? ?Referring: Dr. Aldean Baker and [place] Persons, PA-C ? ?ROS Otherwise per HPI. ? ?Assessment & Plan: ?Visit Diagnoses:  ?  ICD-10-CM   ?1. Cervical radiculopathy  M54.12 XR C-ARM NO REPORT  ?  Epidural Steroid injection  ?  methylPREDNISolone acetate (DEPO-MEDROL) injection 80 mg  ?  ?  ?Plan: No additional findings.  ? ?Meds & Orders:  ?Meds ordered this encounter  ?Medications  ? methylPREDNISolone acetate (DEPO-MEDROL) injection 80 mg  ?  ?Orders Placed This Encounter  ?Procedures  ? XR C-ARM NO REPORT  ? Epidural Steroid injection  ?  ?Follow-up: Return if symptoms worsen or fail to improve.  ? ?Procedures: ?No procedures performed  ?Cervical Epidural Steroid Injection - Interlaminar Approach with Fluoroscopic Guidance ? ?Patient: Stacey Flores      ?Date of Birth: 12-27-1970 ?MRN: [id number] ?PCP: Maurice Small, MD      ?Visit Date: 03/01/2022 ?  ?Universal Protocol:    ?Date/Time: 03/01/2310:38 AM ? ?Consent Given By: the patient ? ?Position: PRONE ? ?Additional Comments: ?Vital signs were monitored before and after the procedure. ?Patient was  prepped and draped in the usual sterile fashion. ?The correct patient, procedure, and site was verified. ? ? ?Injection Procedure Details:  ? ?Procedure diagnoses: Cervical radiculopathy [M54.12]   ? ?Meds Administered:  ?Meds ordered this encounter  ?Medications  ? methylPREDNISolone acetate (DEPO-MEDROL) injection 80 mg  ?  ? ?Laterality: Left ? ?Location/Site: C7-T1 ? ?Needle: 3.5 in., 20 ga. Tuohy ? ?Needle Placement: Paramedian epidural space ? ?Findings: ? -Comments: Excellent flow of contrast into the epidural space. ? ?Procedure Details: ?Using a paramedian approach from the side mentioned above, the region overlying the inferior lamina was localized under fluoroscopic visualization and the soft tissues overlying this structure were infiltrated with 4 ml. of 1% Lidocaine without Epinephrine. A # 20 gauge, Tuohy needle was inserted into the epidural space using a paramedian approach. ? ?The epidural space was localized using loss of resistance along with contralateral oblique bi-planar fluoroscopic views.  After negative aspirate for air, blood, and CSF, a 2 ml. volume of Isovue-250 was injected into the epidural space and the flow of contrast was observed. Radiographs were obtained for documentation purposes.  ? ?The injectate was administered into the level noted above. ? ?Additional Comments:  ?The patient tolerated the procedure well ?Dressing: 2 x 2 sterile gauze and Band-Aid ?  ? ?Post-procedure details: ?Patient was observed during the procedure. ?Post-procedure instructions were reviewed. ? ?Patient left the clinic in stable condition.  ? ?Clinical History: ?MRI CERVICAL SPINE WITHOUT CONTRAST  ?   ?TECHNIQUE:  ?Multiplanar, multisequence MR imaging of the cervical  spine was  ?performed. No intravenous contrast was administered.  ?   ?COMPARISON:  Cervical spine radiographs 01/13/2021  ?   ?FINDINGS:  ?Alignment: Straightening/slight reversal of the normal cervical  ?lordosis. No significant  listhesis.  ?   ?Vertebrae: No fracture, suspicious osseous lesion, or significant  ?marrow edema.  ?   ?Cord: Normal signal.  ?   ?Posterior Fossa, vertebral arteries, paraspinal tissues:  ?Unremarkable.  ?   ?Disc levels:  ?   ?C2-3: Negative.  ?   ?C3-4: Tiny central disc protrusion without stenosis.  ?   ?C4-5: Mild disc space narrowing. Mild disc bulging without stenosis.  ?   ?C5-6: Mild disc space narrowing. Mild disc bulging, a small left  ?paracentral disc protrusion, and mild uncovertebral spurring result  ?in mild spinal stenosis with slight left ventral cord flattening and  ?mild right neural foraminal stenosis. Patent left neural foramen.  ?   ?C6-7: Small right paracentral disc protrusion without stenosis or  ?spinal cord mass effect.  ?   ?C7-T1: Negative.  ?   ?IMPRESSION:  ?1. Mild cervical disc degeneration, most notable at C5-6 where there  ?is mild spinal stenosis.  ?2. Small right paracentral disc protrusion at C6-7 without stenosis.  ?   ?   ?Electronically Signed  ?  By: Sebastian Ache M.D.  ?  On: 03/13/2021 17:18  ? ? ? ?Objective:  VS:  HT:    WT:   BMI:     BP:133/83  HR:74bpm  TEMP: ( )  RESP:  ?Physical Exam ?Vitals and nursing note reviewed.  ?Constitutional:   ?   General: She is not in acute distress. ?   Appearance: Normal appearance. She is not ill-appearing.  ?HENT:  ?   Head: Normocephalic and atraumatic.  ?   Right Ear: External ear normal.  ?   Left Ear: External ear normal.  ?Eyes:  ?   Extraocular Movements: Extraocular movements intact.  ?Cardiovascular:  ?   Rate and Rhythm: Normal rate.  ?   Pulses: Normal pulses.  ?Musculoskeletal:  ?   Cervical back: Tenderness present. No rigidity.  ?   Right lower leg: No edema.  ?   Left lower leg: No edema.  ?   Comments: Patient has good strength in the upper extremities including 5 out of 5 strength in wrist extension long finger flexion and APB.  There is no atrophy of the hands intrinsically.  There is a negative Hoffmann's  test. ?  ?Lymphadenopathy:  ?   Cervical: No cervical adenopathy.  ?Skin: ?   Findings: No erythema, lesion or rash.  ?Neurological:  ?   General: No focal deficit present.  ?   Mental Status: She is alert and oriented to person, place, and time.  ?   Sensory: No sensory deficit.  ?   Motor: No weakness or abnormal muscle tone.  ?   Coordination: Coordination normal.  ?Psychiatric:     ?   Mood and Affect: Mood normal.     ?   Behavior: Behavior normal.  ?  ? ?Imaging: ?XR C-ARM NO REPORT ? ?Result Date: 03/01/2022 ?Please see Notes tab for imaging impression.  ?

## 2022-03-01 NOTE — Procedures (Signed)
Cervical Epidural Steroid Injection - Interlaminar Approach with Fluoroscopic Guidance ? ?Patient: Stacey Flores      ?Date of Birth: 03-18-1971 ?MRN: [id number] ?PCP: Maurice Small, MD      ?Visit Date: 03/01/2022 ?  ?Universal Protocol:    ?Date/Time: 03/01/2310:38 AM ? ?Consent Given By: the patient ? ?Position: PRONE ? ?Additional Comments: ?Vital signs were monitored before and after the procedure. ?Patient was prepped and draped in the usual sterile fashion. ?The correct patient, procedure, and site was verified. ? ? ?Injection Procedure Details:  ? ?Procedure diagnoses: Cervical radiculopathy [M54.12]   ? ?Meds Administered:  ?Meds ordered this encounter  ?Medications  ? methylPREDNISolone acetate (DEPO-MEDROL) injection 80 mg  ?  ? ?Laterality: Left ? ?Location/Site: C7-T1 ? ?Needle: 3.5 in., 20 ga. Tuohy ? ?Needle Placement: Paramedian epidural space ? ?Findings: ? -Comments: Excellent flow of contrast into the epidural space. ? ?Procedure Details: ?Using a paramedian approach from the side mentioned above, the region overlying the inferior lamina was localized under fluoroscopic visualization and the soft tissues overlying this structure were infiltrated with 4 ml. of 1% Lidocaine without Epinephrine. A # 20 gauge, Tuohy needle was inserted into the epidural space using a paramedian approach. ? ?The epidural space was localized using loss of resistance along with contralateral oblique bi-planar fluoroscopic views.  After negative aspirate for air, blood, and CSF, a 2 ml. volume of Isovue-250 was injected into the epidural space and the flow of contrast was observed. Radiographs were obtained for documentation purposes.  ? ?The injectate was administered into the level noted above. ? ?Additional Comments:  ?The patient tolerated the procedure well ?Dressing: 2 x 2 sterile gauze and Band-Aid ?  ? ?Post-procedure details: ?Patient was observed during the procedure. ?Post-procedure instructions were  reviewed. ? ?Patient left the clinic in stable condition. ?

## 2022-03-01 NOTE — Patient Instructions (Signed)

## 2022-05-18 ENCOUNTER — Other Ambulatory Visit: Payer: Self-pay | Admitting: Physical Medicine and Rehabilitation

## 2022-05-18 ENCOUNTER — Telehealth: Payer: Self-pay | Admitting: Physical Medicine and Rehabilitation

## 2022-05-18 DIAGNOSIS — M5412 Radiculopathy, cervical region: Secondary | ICD-10-CM

## 2022-05-18 NOTE — Telephone Encounter (Signed)
Pt called asking for appt. Please call pt at [phone].

## 2022-05-19 ENCOUNTER — Telehealth: Payer: Self-pay | Admitting: Physical Medicine and Rehabilitation

## 2022-05-19 NOTE — Telephone Encounter (Signed)
Pt returned call to [facility]. Please call pt at [phone].

## 2022-06-22 ENCOUNTER — Ambulatory Visit: Payer: BC Managed Care – PPO | Admitting: Physical Medicine and Rehabilitation

## 2022-06-22 ENCOUNTER — Encounter: Payer: Self-pay | Admitting: Physical Medicine and Rehabilitation

## 2022-06-22 ENCOUNTER — Ambulatory Visit: Payer: Self-pay

## 2022-06-22 VITALS — BP 148/80 | HR 85

## 2022-06-22 DIAGNOSIS — M5412 Radiculopathy, cervical region: Secondary | ICD-10-CM | POA: Diagnosis not present

## 2022-06-22 MED ORDER — METHYLPREDNISOLONE ACETATE 80 MG/ML IJ SUSP
80.0000 mg | Freq: Once | INTRAMUSCULAR | Status: AC
  Administered 2022-06-22: 80 mg

## 2022-06-22 NOTE — Progress Notes (Signed)
Pt state neck pain that travels to her left shoulder. Pt state driving and turning her her makes the pain worse, Pt state she takes pain meds to hep ease her pain.  Numeric Pain Rating Scale and Functional Assessment Average Pain 4   In the last MONTH (on 0-10 scale) has pain interfered with the following?  1. General activity like being  able to carry out your everyday physical activities such as walking, climbing stairs, carrying groceries, or moving a chair?  Rating(8)   +Driver, -BT, -Dye Allergies.

## 2022-06-22 NOTE — Patient Instructions (Signed)

## 2022-06-28 NOTE — Progress Notes (Signed)
[name] - 51 y.o. female MRN [id number]  Date of birth: 12-12-1970  Office Visit Note: Visit Date: 06/22/2022 PCP: Maurice Small, MD Referred by: Maurice Small, MD  Subjective: Chief Complaint  Patient presents with   Neck - Pain   Left Shoulder - Pain   HPI:  ZANAI MALLARI is a 51 y.o. female who comes in today for planned repeat Left C7-T1  Cervical Interlaminar epidural steroid injection with fluoroscopic guidance.  The patient has failed conservative care including home exercise, medications, time and activity modification.  This injection will be diagnostic and hopefully therapeutic.  Please see requesting physician notes for further details and justification. Patient received more than 50% pain relief from prior injection.   Referring: Dr. Aldean Baker and [place] Persons, PA-C   ROS Otherwise per HPI.  Assessment & Plan: Visit Diagnoses:    ICD-10-CM   1. Cervical radiculopathy  M54.12 XR C-ARM NO REPORT    Epidural Steroid injection    methylPREDNISolone acetate (DEPO-MEDROL) injection 80 mg      Plan: No additional findings.   Meds & Orders:  Meds ordered this encounter  Medications   methylPREDNISolone acetate (DEPO-MEDROL) injection 80 mg    Orders Placed This Encounter  Procedures   XR C-ARM NO REPORT   Epidural Steroid injection    Follow-up: Return for visit to requesting provider as needed.   Procedures: No procedures performed  Cervical Epidural Steroid Injection - Interlaminar Approach with Fluoroscopic Guidance  Patient: RYNLEE LISBON      Date of Birth: 1971/01/21 MRN: [id number] PCP: Maurice Small, MD      Visit Date: 06/22/2022   Universal Protocol:    Date/Time: 07/31/239:08 PM  Consent Given By: the patient  Position: PRONE  Additional Comments: Vital signs were monitored before and after the procedure. Patient was prepped and draped in the usual sterile fashion. The correct patient, procedure, and site was  verified.   Injection Procedure Details:   Procedure diagnoses: Cervical radiculopathy [M54.12]    Meds Administered:  Meds ordered this encounter  Medications   methylPREDNISolone acetate (DEPO-MEDROL) injection 80 mg     Laterality: Left  Location/Site: C7-T1  Needle: 3.5 in., 20 ga. Tuohy  Needle Placement: Paramedian epidural space  Findings:  -Comments: Excellent flow of contrast into the epidural space.  Procedure Details: Using a paramedian approach from the side mentioned above, the region overlying the inferior lamina was localized under fluoroscopic visualization and the soft tissues overlying this structure were infiltrated with 4 ml. of 1% Lidocaine without Epinephrine. A # 20 gauge, Tuohy needle was inserted into the epidural space using a paramedian approach.  The epidural space was localized using loss of resistance along with contralateral oblique bi-planar fluoroscopic views.  After negative aspirate for air, blood, and CSF, a 2 ml. volume of Isovue-250 was injected into the epidural space and the flow of contrast was observed. Radiographs were obtained for documentation purposes.   The injectate was administered into the level noted above.  Additional Comments:  The patient tolerated the procedure well Dressing: 2 x 2 sterile gauze and Band-Aid    Post-procedure details: Patient was observed during the procedure. Post-procedure instructions were reviewed.  Patient left the clinic in stable condition.   Clinical History: MRI CERVICAL SPINE WITHOUT CONTRAST     TECHNIQUE:  Multiplanar, multisequence MR imaging of the cervical spine was  performed. No intravenous contrast was administered.     COMPARISON:  Cervical spine radiographs 01/13/2021  FINDINGS:  Alignment: Straightening/slight reversal of the normal cervical  lordosis. No significant listhesis.     Vertebrae: No fracture, suspicious osseous lesion, or significant  marrow edema.      Cord: Normal signal.     Posterior Fossa, vertebral arteries, paraspinal tissues:  Unremarkable.     Disc levels:     C2-3: Negative.     C3-4: Tiny central disc protrusion without stenosis.     C4-5: Mild disc space narrowing. Mild disc bulging without stenosis.     C5-6: Mild disc space narrowing. Mild disc bulging, a small left  paracentral disc protrusion, and mild uncovertebral spurring result  in mild spinal stenosis with slight left ventral cord flattening and  mild right neural foraminal stenosis. Patent left neural foramen.     C6-7: Small right paracentral disc protrusion without stenosis or  spinal cord mass effect.     C7-T1: Negative.     IMPRESSION:  1. Mild cervical disc degeneration, most notable at C5-6 where there  is mild spinal stenosis.  2. Small right paracentral disc protrusion at C6-7 without stenosis.        Electronically Signed    By: Sebastian Ache M.D.    On: 03/13/2021 17:18     Objective:  VS:  HT:    WT:   BMI:     BP:(!) 148/80  HR:85bpm  TEMP: ( )  RESP:  Physical Exam Vitals and nursing note reviewed.  Constitutional:      General: She is not in acute distress.    Appearance: Normal appearance. She is not ill-appearing.  HENT:     Head: Normocephalic and atraumatic.     Right Ear: External ear normal.     Left Ear: External ear normal.  Eyes:     Extraocular Movements: Extraocular movements intact.  Cardiovascular:     Rate and Rhythm: Normal rate.     Pulses: Normal pulses.  Musculoskeletal:     Cervical back: Tenderness present. No rigidity.     Right lower leg: No edema.     Left lower leg: No edema.     Comments: Patient has good strength in the upper extremities including 5 out of 5 strength in wrist extension long finger flexion and APB.  There is no atrophy of the hands intrinsically.  There is a negative Hoffmann's test.   Lymphadenopathy:     Cervical: No cervical adenopathy.  Skin:    Findings: No erythema,  lesion or rash.  Neurological:     General: No focal deficit present.     Mental Status: She is alert and oriented to person, place, and time.     Sensory: No sensory deficit.     Motor: No weakness or abnormal muscle tone.     Coordination: Coordination normal.  Psychiatric:        Mood and Affect: Mood normal.        Behavior: Behavior normal.      Imaging: No results found.

## 2022-06-28 NOTE — Procedures (Signed)
Cervical Epidural Steroid Injection - Interlaminar Approach with Fluoroscopic Guidance  Patient: Stacey Flores      Date of Birth: [birth year] MRN: [id number] PCP: Maurice Small, MD      Visit Date: 06/22/2022   Universal Protocol:    Date/Time: 07/31/239:08 PM  Consent Given By: the patient  Position: PRONE  Additional Comments: Vital signs were monitored before and after the procedure. Patient was prepped and draped in the usual sterile fashion. The correct patient, procedure, and site was verified.   Injection Procedure Details:   Procedure diagnoses: Cervical radiculopathy [M54.12]    Meds Administered:  Meds ordered this encounter  Medications   methylPREDNISolone acetate (DEPO-MEDROL) injection 80 mg     Laterality: Left  Location/Site: C7-T1  Needle: 3.5 in., 20 ga. Tuohy  Needle Placement: Paramedian epidural space  Findings:  -Comments: Excellent flow of contrast into the epidural space.  Procedure Details: Using a paramedian approach from the side mentioned above, the region overlying the inferior lamina was localized under fluoroscopic visualization and the soft tissues overlying this structure were infiltrated with 4 ml. of 1% Lidocaine without Epinephrine. A # 20 gauge, Tuohy needle was inserted into the epidural space using a paramedian approach.  The epidural space was localized using loss of resistance along with contralateral oblique bi-planar fluoroscopic views.  After negative aspirate for air, blood, and CSF, a 2 ml. volume of Isovue-250 was injected into the epidural space and the flow of contrast was observed. Radiographs were obtained for documentation purposes.   The injectate was administered into the level noted above.  Additional Comments:  The patient tolerated the procedure well Dressing: 2 x 2 sterile gauze and Band-Aid    Post-procedure details: Patient was observed during the procedure. Post-procedure instructions were  reviewed.  Patient left the clinic in stable condition.

## 2022-07-13 ENCOUNTER — Ambulatory Visit: Payer: BC Managed Care – PPO | Admitting: Physical Medicine and Rehabilitation

## 2022-07-13 ENCOUNTER — Encounter: Payer: Self-pay | Admitting: Physical Medicine and Rehabilitation

## 2022-07-13 DIAGNOSIS — M5412 Radiculopathy, cervical region: Secondary | ICD-10-CM | POA: Diagnosis not present

## 2022-07-13 DIAGNOSIS — M7918 Myalgia, other site: Secondary | ICD-10-CM

## 2022-07-13 DIAGNOSIS — M542 Cervicalgia: Secondary | ICD-10-CM

## 2022-07-13 NOTE — Progress Notes (Unsigned)
[name] - 50 y.o. female MRN [id number]  Date of birth: 24-Apr-1971  Office Visit Note: Visit Date: 07/13/2022 PCP: Maurice Small, MD Referred by: Maurice Small, MD  Subjective: Chief Complaint  Patient presents with   Neck - Pain   Left Shoulder - Pain   HPI: ALAIYAH BOLLMAN is a 51 y.o. female who comes in today for evaluation of chronic bilateral neck pain radiating to shoulders and down left arm.  Pain ongoing for over a year and is exacerbated by lifting and activity.  She describes her pain as a sore, sharp and shooting sensation, currently rates as 3 out of 10.  Patient reports some relief of pain with home exercise regimen, rest and use of medications.  Patient does take Cymbalta that is prescribed by her primary care provider Dr. Maurice Small.  Cervical MRI imaging from 2022 exhibits mild cervical disc degeneration, most notable at C5-6 where there  is mild spinal stenosis and small right paracentral disc protrusion at C6-7 without stenosis.  Patient has underwent multiple cervical epidural steroid injections in our office, most recent was left C7-T1 interlaminar on 06/22/22, she reports greater than 80% relief of pain with this procedure. Patient states left sided radicular symptoms are much better, however she does continue to have bilateral neck pain with some radiation to shoulders. Patient is tearful during our visit and states recent stress with the death of her aunt and having to settle her estate. Patient states she does work for [employer] and her job is high stress. Patient states she is trying to settle back into routine at this time. Patient denies focal weakness, numbness and tingling. Patient denies recent trauma or falls.        Review of Systems  Musculoskeletal:  Positive for myalgias and neck pain.  Neurological:  Positive for tingling and sensory change. Negative for focal weakness and weakness.  All other systems reviewed and are  negative.  Otherwise per HPI.  Assessment & Plan: Visit Diagnoses:    ICD-10-CM   1. Cervical radiculopathy  M54.12     2. Cervicalgia  M54.2     3. Myofascial pain syndrome  M79.18        Plan: Findings:  Chronic bilateral neck pain radiating to both shoulders and down left arm.  Significant and sustained relief of left-sided radicular symptoms from recent cervical epidural steroid injection.  Bilateral neck pain with some radiation to both shoulders does remain, however we feel this is more myofascial related.  Patient does have tenderness and multiple palpable trigger points to bilateral levator scapula and trapezius muscles upon palpation today.  Next step is to continue to monitor patient, I did speak with her about regrouping with physical therapy for manual treatments and dry needling.  Patient does live in [place] and would prefer to attend physical therapy there.  Also encourage patient to speak with primary care provider regarding Cymbalta and continued issues with anxiety.  She may benefit from increasing her dose of Cymbalta to 60 mg daily.  Patient encouraged to let us know if radicular symptoms return and if she would like to proceed with physical therapy to address myofascial issues. No red flag symptoms noted upon exam today.     Meds & Orders: No orders of the defined types were placed in this encounter.  No orders of the defined types were placed in this encounter.   Follow-up: Return if symptoms worsen or fail to improve.   Procedures:  No procedures performed      Clinical History: MRI CERVICAL SPINE WITHOUT CONTRAST     TECHNIQUE:  Multiplanar, multisequence MR imaging of the cervical spine was  performed. No intravenous contrast was administered.     COMPARISON:  Cervical spine radiographs 01/13/2021     FINDINGS:  Alignment: Straightening/slight reversal of the normal cervical  lordosis. No significant listhesis.     Vertebrae: No fracture, suspicious  osseous lesion, or significant  marrow edema.     Cord: Normal signal.     Posterior Fossa, vertebral arteries, paraspinal tissues:  Unremarkable.     Disc levels:     C2-3: Negative.     C3-4: Tiny central disc protrusion without stenosis.     C4-5: Mild disc space narrowing. Mild disc bulging without stenosis.     C5-6: Mild disc space narrowing. Mild disc bulging, a small left  paracentral disc protrusion, and mild uncovertebral spurring result  in mild spinal stenosis with slight left ventral cord flattening and  mild right neural foraminal stenosis. Patent left neural foramen.     C6-7: Small right paracentral disc protrusion without stenosis or  spinal cord mass effect.     C7-T1: Negative.     IMPRESSION:  1. Mild cervical disc degeneration, most notable at C5-6 where there  is mild spinal stenosis.  2. Small right paracentral disc protrusion at C6-7 without stenosis.        Electronically Signed    By: Sebastian Ache M.D.    On: 03/13/2021 17:18   She reports that she has never smoked. She has never used smokeless tobacco. No results for input(s): "HGBA1C", "LABURIC" in the last 8760 hours.  Objective:  VS:  HT:    WT:   BMI:     BP:   HR: bpm  TEMP: ( )  RESP:  Physical Exam Vitals and nursing note reviewed.  HENT:     Head: Normocephalic and atraumatic.     Right Ear: External ear normal.     Left Ear: External ear normal.     Nose: Nose normal.     Mouth/Throat:     Mouth: Mucous membranes are moist.  Eyes:     Extraocular Movements: Extraocular movements intact.  Cardiovascular:     Rate and Rhythm: Normal rate.     Pulses: Normal pulses.  Pulmonary:     Effort: Pulmonary effort is normal.  Abdominal:     General: Abdomen is flat. There is no distension.  Musculoskeletal:        General: Tenderness present.     Cervical back: Tenderness present.     Comments: Discomfort noted with flexion, extension and side-to-side rotation. Patient has  good strength in the upper extremities including 5 out of 5 strength in wrist extension, long finger flexion and APB.  There is no atrophy of the hands intrinsically.  Sensation intact bilaterally.  Tenderness and multiple palpable trigger points noted to bilateral levator scapula and trapezius muscles.  Negative Hoffman's sign.   Skin:    General: Skin is warm and dry.     Capillary Refill: Capillary refill takes less than 2 seconds.  Neurological:     General: No focal deficit present.     Mental Status: She is alert and oriented to person, place, and time.  Psychiatric:        Mood and Affect: Mood normal.        Behavior: Behavior normal.     Ortho Exam  Imaging:  No results found.  Past Medical/Family/Surgical/Social History: Medications & Allergies reviewed per EMR, new medications updated. Patient Active Problem List   Diagnosis Date Noted   Abnormal uterine bleeding 03/15/2018   Past Medical History:  Diagnosis Date   GERD (gastroesophageal reflux disease)    HSV infection    Seasonal allergies    Family History  Problem Relation Age of Onset   Breast cancer Mother    Past Surgical History:  Procedure Laterality Date   BREAST BIOPSY Left    benign   BREAST EXCISIONAL BIOPSY     COLONOSCOPY     TOTAL LAPAROSCOPIC HYSTERECTOMY WITH SALPINGECTOMY Bilateral 03/15/2018   Procedure: TOTAL LAPAROSCOPIC HYSTERECTOMY WITH SALPINGECTOMY;  Surgeon: Myna Hidalgo, DO;  Location: [facility];  Service: Gynecology;  Laterality: Bilateral;   TUBAL LIGATION     UPPER GI ENDOSCOPY     WISDOM TOOTH EXTRACTION     Social History   Occupational History   Not on file  Tobacco Use   Smoking status: Never   Smokeless tobacco: Never  Vaping Use   Vaping Use: Never used  Substance and Sexual Activity   Alcohol use: Yes    Comment: occaional    Drug use: Never   Sexual activity: Yes    Birth control/protection: Surgical

## 2022-07-13 NOTE — Progress Notes (Unsigned)
Pt has hx of inj on 06/22/22 pt state it helped but she still have sum pain. Pt state she still has some pain in her neck and left shoulder.  Numeric Pain Rating Scale and Functional Assessment Average Pain 4 Pain Right Now 3 My pain is constant, tingling, and aching Pain is worse with: some activites Pain improves with: heat/ice, medication, and injections   In the last MONTH (on 0-10 scale) has pain interfered with the following?  1. General activity like being  able to carry out your everyday physical activities such as walking, climbing stairs, carrying groceries, or moving a chair?  Rating(5)  2. Relation with others like being able to carry out your usual social activities and roles such as  activities at home, at work and in your community. Rating(6)  3. Enjoyment of life such that you have  been bothered by emotional problems such as feeling anxious, depressed or irritable?  Rating(7)

## 2022-09-10 ENCOUNTER — Ambulatory Visit: Payer: BC Managed Care – PPO | Admitting: Physician Assistant

## 2022-10-05 DIAGNOSIS — M25521 Pain in right elbow: Secondary | ICD-10-CM | POA: Insufficient documentation

## 2022-10-20 DIAGNOSIS — M7711 Lateral epicondylitis, right elbow: Secondary | ICD-10-CM | POA: Insufficient documentation

## 2022-10-26 NOTE — Progress Notes (Signed)
Office Visit Note  Patient: Stacey Flores             Date of Birth: [birth year]           MRN: [id number]             PCP: Kelton Pillar, MD Referring: Collene Leyden, MD Visit Date: 11/03/2022 Occupation: _0 @  Subjective:  Positive ANA  History of Present Illness: Stacey Flores is a 51 y.o. female who presents today for a new patient consultation for evaluation of a positive ANA.  Patient reports that she has been experiencing mild arthralgias for the past 15 years.  Of note [place] and is always been physically active and has performed strenuous activities in the past.  She states that she has noticed progressively worsening arthralgias and joint stiffness over the past 2 years.  Her arthralgias acutely worsened after 2 previous COVID-19 infections.  She has not noticed any joint swelling but experiences intermittent pain in both wrist joints, both hands, both knees, and both feet.  She has difficulty climbing steps and rising from a seated position due to the discomfort in her knees.  She has difficulty standing for prolonged periods of time due to discomfort in her knees and feet.  She has been taking Tylenol at bedtime for pain relief.  She has been under the care of her PCP as well as orthopedics.  Her pain has been most severe in her neck and lower back.  She has had several epidural injections in the past, most recently on 06/22/2022 performed by Dr. Ernestina Patches.  She currently is going to physical therapy for right lateral epicondylitis.  She denies any Achilles tendinitis or plantar fasciitis. She remains on Cymbalta 30 mg twice daily which has been helpful for her neck pain and neurologist.  Patient reports that she does not tolerate prednisone. Patient reports that she was previously diagnosed with rosacea by her allergist.  She denies any other recent rashes.  She states she has noticed increased hair thinning but is unsure what the trigger has been.  She states  that she intermittently has 1 nasal ulcer but denies any oral ulcers.  She has not had any sicca symptoms.  No Raynaud's phenomenon.  She denies any pleuritic chest pain or shortness of breath.  She denies any swollen lymph nodes.  She continues to have chronic fatigue.     Activities of Daily Living:  Patient reports morning stiffness for 1-1.5 hours.   Patient Reports nocturnal pain.  Difficulty dressing/grooming: Denies Difficulty climbing stairs: Reports Difficulty getting out of chair: Denies Difficulty using hands for taps, buttons, cutlery, and/or writing: Reports  Review of Systems  Constitutional:  Positive for fatigue.  HENT:  Negative for mouth sores, mouth dryness and nose dryness.   Eyes:  Negative for pain, visual disturbance and dryness.  Respiratory:  Negative for cough, hemoptysis, shortness of breath and difficulty breathing.   Cardiovascular:  Positive for palpitations. Negative for chest pain, hypertension and swelling in legs/feet.  Gastrointestinal:  Negative for blood in stool, constipation and diarrhea.  Endocrine: Negative for increased urination.  Genitourinary:  Negative for painful urination.  Musculoskeletal:  Positive for joint pain, joint pain, morning stiffness and muscle tenderness. Negative for joint swelling, myalgias, muscle weakness and myalgias.  Skin:  Positive for hair loss, redness and sensitivity to sunlight. Negative for color change, pallor, rash, nodules/bumps, skin tightness and ulcers.  Allergic/Immunologic: Negative for susceptible to infections.  Neurological:  Negative for dizziness, numbness, headaches and weakness.  Hematological:  Negative for swollen glands.  Psychiatric/Behavioral:  Positive for sleep disturbance. Negative for depressed mood. The patient is not nervous/anxious.     PMFS History:  Patient Active Problem List   Diagnosis Date Noted   Abnormal uterine bleeding 03/15/2018    Past Medical History:  Diagnosis Date    GERD (gastroesophageal reflux disease)    HSV infection    Seasonal allergies     Family History  Problem Relation Age of Onset   Breast cancer Mother    AAA (abdominal aortic aneurysm) Father    Past Surgical History:  Procedure Laterality Date   BREAST BIOPSY Left    benign   BREAST EXCISIONAL BIOPSY     COLONOSCOPY     TOTAL LAPAROSCOPIC HYSTERECTOMY WITH SALPINGECTOMY Bilateral 03/15/2018   Procedure: TOTAL LAPAROSCOPIC HYSTERECTOMY WITH SALPINGECTOMY;  Surgeon: Janyth Pupa, DO;  Location: [facility];  Service: Gynecology;  Laterality: Bilateral;   TUBAL LIGATION     UPPER GI ENDOSCOPY     WISDOM TOOTH EXTRACTION     Social History   Social History Narrative   Not on file    There is no immunization history on file for this patient.   Objective: Vital Signs: BP 126/83 (BP Location: Right Arm, Patient Position: Sitting, Cuff Size: Large)   Pulse 82   Resp 16   Ht _0  (1.626 m)   Wt 254 lb (115.2 kg)   LMP  (LMP Unknown) Comment: Cont.bleeding  BMI 43.60 kg/m    Physical Exam Vitals and nursing note reviewed.  Constitutional:      Appearance: She is well-developed.  HENT:     Head: Normocephalic and atraumatic.     Mouth/Throat:     Comments: No oral ulcers Eyes:     Conjunctiva/sclera: Conjunctivae normal.  Cardiovascular:     Rate and Rhythm: Normal rate and regular rhythm.     Heart sounds: Normal heart sounds.  Pulmonary:     Effort: Pulmonary effort is normal.     Breath sounds: Normal breath sounds.  Abdominal:     General: Bowel sounds are normal.     Palpations: Abdomen is soft.  Musculoskeletal:     Cervical back: Normal range of motion.  Skin:    General: Skin is warm and dry.     Capillary Refill: Capillary refill takes less than 2 seconds.     Comments: Diffuse facial erythema consistent with rosacea noted. No digital ulcers or signs of gangrene. No telangiectasias noted.  No fingernail pitting or nail dystrophy noted.    Neurological:     Mental Status: She is alert and oriented to person, place, and time.  Psychiatric:        Behavior: Behavior normal.      Musculoskeletal Exam: C-spine has good range of motion with discomfort.  Some midline spinal tenderness in the C-spine and lumbar spine region.  Some tenderness over both SI joints.  Shoulder joints have good range of motion with some discomfort bilaterally.  Tenderness over the right lateral epicondyle.  Wrist joints have good range of motion with mild tenderness.  No tenderness or synovitis over MCP joints.  Complete fist formation noted bilaterally.  Hip joints have good range of motion with no groin pain.  Mild tenderness over trochanteric bursa bilaterally.  Knee joints have good range of motion with no warmth or effusion.  No Baker's cyst palpable.  No calf tightness or tenderness.  Ankle joints have good range of motion with no tenderness or synovitis.  Tenderness over several MTP joints as noted below.  No evidence of Achilles tendinitis or plantar fasciitis.  CDAI Exam: CDAI Score: -- Patient Global: --; Provider Global: -- Swollen: 0 ; Tender: 13  Joint Exam 11/03/2022      Right  Left  Glenohumeral   Tender   Tender  Wrist   Tender   Tender  Cervical Spine   Tender     Lumbar Spine   Tender     Sacroiliac   Tender   Tender  MTP 1      Tender  MTP 2   Tender   Tender  MTP 3   Tender     MTP 4   Tender        Investigation: No additional findings.  Imaging: No results found.  Recent Labs: Lab Results  Component Value Date   WBC 15.0 (H) 03/16/2018   HGB 10.8 (L) 03/16/2018   PLT 226 03/16/2018   NA 135 03/16/2018   K 3.4 (L) 03/16/2018   CL 102 03/16/2018   CO2 24 03/16/2018   GLUCOSE 118 (H) 03/16/2018   BUN 9 03/16/2018   CREATININE 0.77 03/16/2018   BILITOT 0.6 03/13/2018   ALKPHOS 51 03/13/2018   AST 21 03/13/2018   ALT 25 03/13/2018   PROT 7.2 03/13/2018   ALBUMIN 3.7 03/13/2018   CALCIUM 8.1 (L) 03/16/2018    GFRAA >60 03/16/2018    Speciality Comments: No specialty comments available.  Procedures:  No procedures performed Allergies: Patient has no known allergies.   Assessment / Plan:     Visit Diagnoses: Positive ANA (antinuclear antibody) - 09/08/22: ANA+, RF<10, ESR 33, CRP 9: Patient presents today with history of chronic fatigue, 1 intermittent nasal ulcer, arthralgias, and hair thinning.   She has a second cousin with lupus but no other family history of autoimmune disease or inflammatory arthritis.   She has not had any oral ulcers, Raynaud's phenomenon, malar rash, cervical lymphadenopathy, or sicca symptoms. She has no obvious synovitis on examination today. Lab work from 09/08/2022 was reviewed with the patient today in the office.   The following lab work will be obtained today for further evaluation.  X-rays of both hands, both knees, and both feet were also obtained today for further evaluation.  We will discuss results at her new patient follow-up visit.  She will notify us if she develops any new or worsening symptoms in the meantime. - Plan: ANA, RNP Antibody, Anti-scleroderma antibody, Anti-Smith antibody, Sjogrens syndrome-A extractable nuclear antibody, Sjogrens syndrome-B extractable nuclear antibody, Anti-DNA antibody, double-stranded, C3 and C4, Beta-2 glycoprotein antibodies, Cardiolipin antibodies, IgG, IgM, IgA, Lupus Anticoagulant Eval w/Reflex, TSH, CBC with Differential/Platelet, COMPLETE METABOLIC PANEL WITH GFR  Polyarthralgia -She initially experienced mild arthralgias starting about 15 years ago.  Of note the patient [place] and has always tried to remain physically active.  For the past 2 years she has been experiencing increased arthralgias and joint stiffness.  She has not noticed any joint swelling.  She has no family history of inflammatory arthritis.  She takes tylenol at bedtime for pain relief.  On examination no obvious synovitis was noted.  X-rays of  both hands, both knees, and both feet were obtained today for further evaluation.  The following lab work was also obtained and will be reviewed in person at her new patient follow-up visit.  Plan: Sedimentation rate, C-reactive protein, CK,  Cyclic citrul peptide antibody, IgG, TSH  Pain in both hands - RF negative on 09/08/22:  Patient experiences intermittent soreness and stiffness in both hands and her wrist joints.  On examination today she has some tenderness over both wrist joints but has good range of motion with no synovitis.  No tenderness or synovitis over MCP joints noted.  X-rays of both hands were obtained today for further evaluation.  The following lab work will also be obtained. Plan: XR Hand 2 View Left, XR Hand 2 View Right, Sedimentation rate, C-reactive protein, Cyclic citrul peptide antibody, IgG  Lateral epicondylitis, right elbow: Under the care of Dr. Greta Doom.  Patient is currently going to physical therapy.  She has tenderness over the lateral condyle of the right elbow.  Chronic pain of both knees - Patient experiences intermittent pain and stiffness in both knee joints.  She experiences discomfort when rising from a seated position.  She also has difficulty climbing steps or walking on an incline.  She has not noticed any swelling in her knees.  No recent injury or fall.  No mechanical symptoms.  X-rays of both knees were obtained today for further evaluation.  Plan: XR KNEE 3 VIEW RIGHT, XR KNEE 3 VIEW LEFT  Pain in both feet -Patient experiences intermittent pain and stiffness in both feet especially after standing or walking for prolonged periods of time.  On examination she has good range of motion of both ankle joints with no tenderness or synovitis.  No evidence of Achilles tendinitis or plantar fasciitis.  She has tenderness over several MTP joints as described above.  No active synovitis was noted.  No dactylitis noted.  X-rays of both feet were obtained today for further  evaluation.  The following lab work will also be obtained today for further evaluation.  Plan: XR Foot 2 Views Left, XR Foot 2 Views Right, Sedimentation rate, C-reactive protein, Cyclic citrul peptide antibody, IgG  Other fatigue - She experiences chronic fatigue on a daily basis.  She continues to work full-time and has been under increased stress over the past 6 months which has contributed to her level of fatigue.  The following lab work will be obtained today for further evaluation.  Plan: Sedimentation rate, C-reactive protein, CK, ANA, TSH, CBC with Differential/Platelet, COMPLETE METABOLIC PANEL WITH GFR  Cervicalgia: Chronic pain and stiffness.  Midline spinal tenderness in the C-spine region noted today. Patient has been under the care of Dr. Ernestina Patches.  She has had several epidural injections in the past.  Her most recent repeat left C7-T1 cervical interlaminar epidural steroid injection with fluoroscopic guidance was administered on 06/22/2022.  Other medical conditions are listed as follows:   History of diverticulosis  Hypercholesterolemia  History of esophageal dilatation  History of gastroesophageal reflux (GERD)  Rosacea  Prediabetes  History of HPV infection  HSV-2 infection    Orders: Orders Placed This Encounter  Procedures   XR Hand 2 View Left   XR Hand 2 View Right   XR Foot 2 Views Left   XR Foot 2 Views Right   XR KNEE 3 VIEW RIGHT   XR KNEE 3 VIEW LEFT   Sedimentation rate   C-reactive protein   CK   ANA   RNP Antibody   Anti-scleroderma antibody   Anti-Smith antibody   Sjogrens syndrome-A extractable nuclear antibody   Sjogrens syndrome-B extractable nuclear antibody   Anti-DNA antibody, double-stranded   C3 and C4   Beta-2 glycoprotein antibodies   Cardiolipin antibodies,  IgG, IgM, IgA   Lupus Anticoagulant Eval w/Reflex   Cyclic citrul peptide antibody, IgG   TSH   CBC with Differential/Platelet   COMPLETE METABOLIC PANEL WITH GFR   No  orders of the defined types were placed in this encounter.    Follow-Up Instructions: Return in about 4 weeks (around 12/01/2022) for NPFU.   Ofilia Neas, PA-C  Note - This record has been created using Dragon software.  Chart creation errors have been sought, but may not always  have been located. Such creation errors do not reflect on  the standard of medical care.

## 2022-10-27 ENCOUNTER — Other Ambulatory Visit: Payer: Self-pay | Admitting: Family Medicine

## 2022-10-27 DIAGNOSIS — Z1231 Encounter for screening mammogram for malignant neoplasm of breast: Secondary | ICD-10-CM

## 2022-11-03 ENCOUNTER — Encounter: Payer: Self-pay | Admitting: Physician Assistant

## 2022-11-03 ENCOUNTER — Ambulatory Visit (INDEPENDENT_AMBULATORY_CARE_PROVIDER_SITE_OTHER): Payer: BC Managed Care – PPO

## 2022-11-03 ENCOUNTER — Ambulatory Visit: Payer: BC Managed Care – PPO | Attending: Physician Assistant | Admitting: Physician Assistant

## 2022-11-03 ENCOUNTER — Ambulatory Visit: Payer: BC Managed Care – PPO

## 2022-11-03 VITALS — BP 126/83 | HR 82 | Resp 16 | Ht 64.0 in | Wt 254.0 lb

## 2022-11-03 DIAGNOSIS — M79671 Pain in right foot: Secondary | ICD-10-CM

## 2022-11-03 DIAGNOSIS — M25561 Pain in right knee: Secondary | ICD-10-CM

## 2022-11-03 DIAGNOSIS — G8929 Other chronic pain: Secondary | ICD-10-CM | POA: Diagnosis not present

## 2022-11-03 DIAGNOSIS — Z8619 Personal history of other infectious and parasitic diseases: Secondary | ICD-10-CM

## 2022-11-03 DIAGNOSIS — M79641 Pain in right hand: Secondary | ICD-10-CM

## 2022-11-03 DIAGNOSIS — M542 Cervicalgia: Secondary | ICD-10-CM

## 2022-11-03 DIAGNOSIS — M79672 Pain in left foot: Secondary | ICD-10-CM | POA: Diagnosis not present

## 2022-11-03 DIAGNOSIS — Z8719 Personal history of other diseases of the digestive system: Secondary | ICD-10-CM | POA: Diagnosis not present

## 2022-11-03 DIAGNOSIS — M25562 Pain in left knee: Secondary | ICD-10-CM | POA: Diagnosis not present

## 2022-11-03 DIAGNOSIS — R7303 Prediabetes: Secondary | ICD-10-CM

## 2022-11-03 DIAGNOSIS — R768 Other specified abnormal immunological findings in serum: Secondary | ICD-10-CM | POA: Diagnosis not present

## 2022-11-03 DIAGNOSIS — M7711 Lateral epicondylitis, right elbow: Secondary | ICD-10-CM

## 2022-11-03 DIAGNOSIS — Z9889 Other specified postprocedural states: Secondary | ICD-10-CM

## 2022-11-03 DIAGNOSIS — M79642 Pain in left hand: Secondary | ICD-10-CM | POA: Diagnosis not present

## 2022-11-03 DIAGNOSIS — M2550 Pain in unspecified joint: Secondary | ICD-10-CM | POA: Diagnosis not present

## 2022-11-03 DIAGNOSIS — L719 Rosacea, unspecified: Secondary | ICD-10-CM

## 2022-11-03 DIAGNOSIS — R5383 Other fatigue: Secondary | ICD-10-CM

## 2022-11-03 DIAGNOSIS — E7800 Pure hypercholesterolemia, unspecified: Secondary | ICD-10-CM

## 2022-11-03 DIAGNOSIS — B009 Herpesviral infection, unspecified: Secondary | ICD-10-CM

## 2022-11-07 LAB — TSH: TSH: 1.69 mIU/L

## 2022-11-07 LAB — ANTI-NUCLEAR AB-TITER (ANA TITER): ANA Titer 1: 1:40 {titer} — ABNORMAL HIGH

## 2022-11-07 LAB — COMPLETE METABOLIC PANEL WITH GFR
AG Ratio: 1.3 (calc) (ref 1.0–2.5)
ALT: 20 U/L (ref 6–29)
AST: 25 U/L (ref 10–35)
Albumin: 4.2 g/dL (ref 3.6–5.1)
Alkaline phosphatase (APISO): 65 U/L (ref 37–153)
BUN: 18 mg/dL (ref 7–25)
CO2: 29 mmol/L (ref 20–32)
Calcium: 9.6 mg/dL (ref 8.6–10.4)
Chloride: 102 mmol/L (ref 98–110)
Creat: 0.71 mg/dL (ref 0.50–1.03)
Globulin: 3.2 g/dL (calc) (ref 1.9–3.7)
Glucose, Bld: 92 mg/dL (ref 65–99)
Potassium: 4 mmol/L (ref 3.5–5.3)
Sodium: 140 mmol/L (ref 135–146)
Total Bilirubin: 0.2 mg/dL (ref 0.2–1.2)
Total Protein: 7.4 g/dL (ref 6.1–8.1)
eGFR: 103 mL/min/{1.73_m2} (ref >=60)

## 2022-11-07 LAB — CBC WITH DIFFERENTIAL/PLATELET
Absolute Monocytes: 532 cells/uL (ref 200–950)
Basophils Absolute: 62 cells/uL (ref 0–200)
Basophils Relative: 1.1 %
Eosinophils Absolute: 213 cells/uL (ref 15–500)
Eosinophils Relative: 3.8 %
HCT: 39.6 % (ref 35.0–45.0)
Hemoglobin: 13.4 g/dL (ref 11.7–15.5)
Lymphs Abs: 1876 cells/uL (ref 850–3900)
MCH: 29.9 pg (ref 27.0–33.0)
MCHC: 33.8 g/dL (ref 32.0–36.0)
MCV: 88.4 fL (ref 80.0–100.0)
MPV: 12.3 fL (ref 7.5–12.5)
Monocytes Relative: 9.5 %
Neutro Abs: 2918 cells/uL (ref 1500–7800)
Neutrophils Relative %: 52.1 %
Platelets: 286 10*3/uL (ref 140–400)
RBC: 4.48 10*6/uL (ref 3.80–5.10)
RDW: 13.1 % (ref 11.0–15.0)
Total Lymphocyte: 33.5 %
WBC: 5.6 10*3/uL (ref 3.8–10.8)

## 2022-11-07 LAB — C3 AND C4
C3 Complement: 174 mg/dL (ref 83–193)
C4 Complement: 36 mg/dL (ref 15–57)

## 2022-11-07 LAB — BETA-2 GLYCOPROTEIN ANTIBODIES
Beta-2 Glyco 1 IgA: <2 U/mL (ref <20.0)
Beta-2 Glyco 1 IgM: <2 U/mL (ref <20.0)
Beta-2 Glyco I IgG: <2 U/mL (ref <20.0)

## 2022-11-07 LAB — LUPUS ANTICOAGULANT EVAL W/ REFLEX
PTT-LA Screen: 33 s (ref <=40)
dRVVT: 41 s (ref <=45)

## 2022-11-07 LAB — CARDIOLIPIN ANTIBODIES, IGG, IGM, IGA
Anticardiolipin IgA: <2 APL-U/mL (ref <20.0)
Anticardiolipin IgG: <2 GPL-U/mL (ref <20.0)
Anticardiolipin IgM: <2 MPL-U/mL (ref <20.0)

## 2022-11-07 LAB — ANTI-DNA ANTIBODY, DOUBLE-STRANDED: ds DNA Ab: <1 IU/mL

## 2022-11-07 LAB — ANA: Anti Nuclear Antibody (ANA): POSITIVE — AB

## 2022-11-07 LAB — ANTI-SMITH ANTIBODY: ENA SM Ab Ser-aCnc: <1 AI

## 2022-11-07 LAB — CK: Total CK: 292 U/L — ABNORMAL HIGH (ref 29–143)

## 2022-11-07 LAB — SJOGRENS SYNDROME-B EXTRACTABLE NUCLEAR ANTIBODY: SSB (La) (ENA) Antibody, IgG: <1 AI

## 2022-11-07 LAB — CYCLIC CITRUL PEPTIDE ANTIBODY, IGG: Cyclic Citrullin Peptide Ab: <16 UNITS

## 2022-11-07 LAB — C-REACTIVE PROTEIN: CRP: 10.7 mg/L — ABNORMAL HIGH (ref <8.0)

## 2022-11-07 LAB — SJOGRENS SYNDROME-A EXTRACTABLE NUCLEAR ANTIBODY: SSA (Ro) (ENA) Antibody, IgG: <1 AI

## 2022-11-07 LAB — SEDIMENTATION RATE: Sed Rate: 33 mm/h — ABNORMAL HIGH (ref 0–30)

## 2022-11-07 LAB — RNP ANTIBODY: Ribonucleic Protein(ENA) Antibody, IgG: <1 AI

## 2022-11-07 LAB — ANTI-SCLERODERMA ANTIBODY: Scleroderma (Scl-70) (ENA) Antibody, IgG: <1 AI

## 2022-11-08 NOTE — Progress Notes (Signed)
Lab work will be discussed in detail at new patient follow up visit.

## 2022-12-03 NOTE — Progress Notes (Signed)
Office Visit Note  Patient: Stacey Flores             Date of Birth: 1971/05/20           MRN: [id number]             PCP: Maurice Small, MD Referring: Maurice Small, MD Visit Date: 12/13/2022 Occupation: @GUAROCC @  Subjective:  Discuss results   History of Present Illness: Stacey Flores is a 52 y.o. female with history of positive ANA and polyarthralgia.  Patient presents today to discuss lab work and x-ray results from her initial office visit.  Patient continues to experience pain involving multiple joints especially in both hands, both knees, and both feet.  She states that she has days where she wakes up and has total body pain to the point that she has difficulty going to work.  During these episodes the pain feels deep rather than superficial.  She denies any obvious joint swelling currently.  She takes tylenol and aleve very sparingly for pain relief.       Activities of Daily Living:  Patient reports morning stiffness for 0 minutes.    Patient Reports nocturnal pain.  Difficulty dressing/grooming: Denies Difficulty climbing stairs: Reports Difficulty getting out of chair: Denies Difficulty using hands for taps, buttons, cutlery, and/or writing: Denies  Review of Systems  Constitutional:  Positive for fatigue.  HENT:  Negative for mouth sores and mouth dryness.   Eyes:  Negative for dryness.  Respiratory:  Negative for shortness of breath.   Cardiovascular:  Negative for chest pain and palpitations.  Gastrointestinal:  Negative for blood in stool, constipation and diarrhea.  Endocrine: Negative for increased urination.  Genitourinary:  Negative for involuntary urination.  Musculoskeletal:  Positive for joint pain, joint pain, joint swelling and muscle tenderness. Negative for gait problem, myalgias, muscle weakness, morning stiffness and myalgias.  Skin:  Positive for sensitivity to sunlight. Negative for color change, rash and hair loss.  Allergic/Immunologic:  Negative for susceptible to infections.  Neurological:  Negative for dizziness.  Hematological:  Negative for swollen glands.  Psychiatric/Behavioral:  Negative for depressed mood and sleep disturbance. The patient is not nervous/anxious.     PMFS History:  Patient Active Problem List   Diagnosis Date Noted   Abnormal uterine bleeding 03/15/2018    Past Medical History:  Diagnosis Date   GERD (gastroesophageal reflux disease)    HSV infection    Seasonal allergies     Family History  Problem Relation Age of Onset   Breast cancer Mother    AAA (abdominal aortic aneurysm) Father    Past Surgical History:  Procedure Laterality Date   BREAST BIOPSY Left    benign   BREAST EXCISIONAL BIOPSY     COLONOSCOPY     TOTAL LAPAROSCOPIC HYSTERECTOMY WITH SALPINGECTOMY Bilateral 03/15/2018   Procedure: TOTAL LAPAROSCOPIC HYSTERECTOMY WITH SALPINGECTOMY;  Surgeon: 03/17/2018, DO;  Location: [facility];  Service: Gynecology;  Laterality: Bilateral;   TUBAL LIGATION     UPPER GI ENDOSCOPY     WISDOM TOOTH EXTRACTION     Social History   Social History Narrative   Not on file    There is no immunization history on file for this patient.   Objective: Vital Signs: BP 127/87 (BP Location: Left Arm, Patient Position: Sitting, Cuff Size: Normal)   Pulse 88   Resp 12   Ht 5\' 5"  (1.651 m)   Wt 256 lb (116.1 kg)   LMP  (LMP Unknown)  Comment: Cont.bleeding  BMI 42.60 kg/m    Physical Exam Vitals and nursing note reviewed.  Constitutional:      Appearance: She is well-developed.  HENT:     Head: Normocephalic and atraumatic.  Eyes:     Conjunctiva/sclera: Conjunctivae normal.  Cardiovascular:     Rate and Rhythm: Normal rate and regular rhythm.     Heart sounds: Normal heart sounds.  Pulmonary:     Effort: Pulmonary effort is normal.     Breath sounds: Normal breath sounds.  Abdominal:     General: Bowel sounds are normal.     Palpations: Abdomen is soft.  Musculoskeletal:      Cervical back: Normal range of motion.  Skin:    General: Skin is warm and dry.     Capillary Refill: Capillary refill takes less than 2 seconds.  Neurological:     Mental Status: She is alert and oriented to person, place, and time.  Psychiatric:        Behavior: Behavior normal.      Musculoskeletal Exam: Generalized hyperalgesia and positive tender points noted on examination today.  C-spine, thoracic spine, lumbar spine have good range of motion.  No midline spinal tenderness or SI joint tenderness.  Shoulder joints, elbow joints, wrist joints, MCPs, PIPs, DIPs have good range of motion with no synovitis.  Complete fist formation bilaterally.  Hip joints have good range of motion with no groin pain.  Knee joints have good range of motion with no warmth or effusion.  Ankle joints have good range of motion with no tenderness or joint swelling.  CDAI Exam: CDAI Score: -- Patient Global: --; Provider Global: -- Swollen: --; Tender: -- Joint Exam 12/13/2022   No joint exam has been documented for this visit   There is currently no information documented on the homunculus. Go to the Rheumatology activity and complete the homunculus joint exam.  Investigation: No additional findings.  Imaging: No results found.  Recent Labs: Lab Results  Component Value Date   WBC 5.6 11/03/2022   HGB 13.4 11/03/2022   PLT 286 11/03/2022   NA 140 11/03/2022   K 4.0 11/03/2022   CL 102 11/03/2022   CO2 29 11/03/2022   GLUCOSE 92 11/03/2022   BUN 18 11/03/2022   CREATININE 0.71 11/03/2022   BILITOT 0.2 11/03/2022   ALKPHOS 51 03/13/2018   AST 25 11/03/2022   ALT 20 11/03/2022   PROT 7.4 11/03/2022   ALBUMIN 3.7 03/13/2018   CALCIUM 9.6 11/03/2022   GFRAA >60 03/16/2018    Speciality Comments: No specialty comments available.  Procedures:  No procedures performed Allergies: Patient has no known allergies.       Assessment / Plan:     Visit Diagnoses: Positive ANA (antinuclear  antibody) -  09/08/22: ANA+, RF<10, ESR 33, CRP 9.  11/03/2022: ANA 1:40NH, ENA negative, history of fatigue, arthralgias, and hair thinning: She has no clinical features of systemic lupus at this time.  Reviewed lab work today in detail.  All questions were addressed.  Discussed that she does not currently meet clinical criteria for systemic lupus.  She was advised to notify us if she develops any new or worsening symptoms.  Recommended repeating ANA in 6 months along with double-stranded and complements.  For further evaluation of the arthralgias she has been experiencing an ultrasound of both hands will be ordered to assess for synovitis.  Elevated sed rate -sed rate was 33 on 11/03/2022.  Sed rate will be rechecked  today.  She will be scheduled for an ultrasound of both hands to assess for synovitis.  Plan: Sedimentation rate, C-reactive protein  Elevated C-reactive protein (CRP) -CRP was 10.7 on 11/03/2022.  CRP will be rechecked today.  Patient will be scheduled for an ultrasound to assess for synovitis.  Plan: Sedimentation rate, C-reactive protein  Polyarthralgia - Anti-CCP negative on 11/03/22.  She has no synovitis on examination today.  She continues to have intermittent arthralgias especially in both hands, both knees, and both feet.  ESR and sed rate were elevated on 11/03/2022.  Plan to schedule an ultrasound of both hands to assess for synovitis.  ESR and CRP will be rechecked today as well.  Primary osteoarthritis of both hands - X-rays of both hands on 11/03/22 were consistent with early osteoarthritis.  X-ray results were discussed today in detail.  Discussed that anti-CCP was negative on 11/03/2022 and her rheumatoid factor was initially negative.  Discussed that her inflammatory markers including sed rate and CRP are both elevated.  Recommend repeating sed rate and CRP today.  Plan to also schedule an ultrasound of both hands to assess for synovitis.  The patient was advised to avoid the use of  NSAIDs prior to scheduling an ultrasound.  She voiced understanding.  Different treatment options for osteoarthritis will be further discussed once inflammatory process has been ruled out. I did discuss the importance of joint protection and muscle strengthening.  I also briefly discussed the use of natural anti-inflammatories.  Chondromalacia of both patellae - X-rays of both knees on 11/03/22 are consistent with mild chondromalacia patella.  Discussed the diagnosis of chondromalacia patella.  Discussed the importance of quad strengthening.  She has good range of motion of both knee joints on examination.  No warmth or effusion noted.  Primary osteoarthritis of both feet - X-rays on 11/03/22 of both feet were consistent with early osteoarthritis.  X-ray results were discussed with the patient today in the office.  All questions were addressed.  She experiences intermittent pain and stiffness in both hands especially if standing for prolonged periods of time.  No tenderness or synovitis of ankle joints noted on examination today.  She is wearing proper fitting shoes.  Lateral epicondylitis, right elbow: She experiences intermittent pain in the right elbow consistent with epicondylitis.  Elevated CK -CK was 292 on 11/03/2022.  She has no muscular weakness at this time.  She has symptoms consistent with myofascial pain.  Aldolase will be checked today.  If aldolase is elevated mild marker 3 panel will be added.  Plan: Aldolase  Myofascial pain: Patient has generalized hyperalgesia and positive tender points on examination today raising the suspicion for myofascial pain.  Briefly discussed the diagnosis of myofascial pain as well as fibromyalgia.  She has no muscular weakness at this time.  CK was 292 on 11/03/2022.  Aldolase will be checked today for further evaluation.  Cervicalgia:  She has good ROM of the C-spine.  Trapezius muscle tension and tenderness bilaterally.    Other medical conditions are  listed as follows:   History of diverticulosis  Hypercholesterolemia  History of esophageal dilatation  History of gastroesophageal reflux (GERD)  Rosacea  Prediabetes  History of HPV infection  HSV-2 infection    Orders: Orders Placed This Encounter  Procedures   Aldolase   Sedimentation rate   C-reactive protein   No orders of the defined types were placed in this encounter.   Follow-Up Instructions: Return in about 6 weeks (around 01/24/2023) for +  ANA, Polyarthalgia.   Gearldine Bienenstock, PA-C  Note - This record has been created using Dragon software.  Chart creation errors have been sought, but may not always  have been located. Such creation errors do not reflect on  the standard of medical care.,

## 2022-12-13 ENCOUNTER — Ambulatory Visit: Payer: BC Managed Care – PPO | Attending: Physician Assistant | Admitting: Physician Assistant

## 2022-12-13 ENCOUNTER — Encounter: Payer: Self-pay | Admitting: Physician Assistant

## 2022-12-13 VITALS — BP 127/87 | HR 88 | Resp 12 | Ht 65.0 in | Wt 256.0 lb

## 2022-12-13 DIAGNOSIS — M2550 Pain in unspecified joint: Secondary | ICD-10-CM | POA: Diagnosis not present

## 2022-12-13 DIAGNOSIS — Z8719 Personal history of other diseases of the digestive system: Secondary | ICD-10-CM

## 2022-12-13 DIAGNOSIS — Z9889 Other specified postprocedural states: Secondary | ICD-10-CM

## 2022-12-13 DIAGNOSIS — M19072 Primary osteoarthritis, left ankle and foot: Secondary | ICD-10-CM

## 2022-12-13 DIAGNOSIS — B009 Herpesviral infection, unspecified: Secondary | ICD-10-CM

## 2022-12-13 DIAGNOSIS — E7800 Pure hypercholesterolemia, unspecified: Secondary | ICD-10-CM

## 2022-12-13 DIAGNOSIS — R7303 Prediabetes: Secondary | ICD-10-CM

## 2022-12-13 DIAGNOSIS — M19041 Primary osteoarthritis, right hand: Secondary | ICD-10-CM

## 2022-12-13 DIAGNOSIS — R700 Elevated erythrocyte sedimentation rate: Secondary | ICD-10-CM | POA: Diagnosis not present

## 2022-12-13 DIAGNOSIS — M7711 Lateral epicondylitis, right elbow: Secondary | ICD-10-CM

## 2022-12-13 DIAGNOSIS — M19042 Primary osteoarthritis, left hand: Secondary | ICD-10-CM

## 2022-12-13 DIAGNOSIS — R7982 Elevated C-reactive protein (CRP): Secondary | ICD-10-CM

## 2022-12-13 DIAGNOSIS — R748 Abnormal levels of other serum enzymes: Secondary | ICD-10-CM

## 2022-12-13 DIAGNOSIS — L719 Rosacea, unspecified: Secondary | ICD-10-CM

## 2022-12-13 DIAGNOSIS — R768 Other specified abnormal immunological findings in serum: Secondary | ICD-10-CM

## 2022-12-13 DIAGNOSIS — M2241 Chondromalacia patellae, right knee: Secondary | ICD-10-CM

## 2022-12-13 DIAGNOSIS — M2242 Chondromalacia patellae, left knee: Secondary | ICD-10-CM

## 2022-12-13 DIAGNOSIS — M19071 Primary osteoarthritis, right ankle and foot: Secondary | ICD-10-CM

## 2022-12-13 DIAGNOSIS — M542 Cervicalgia: Secondary | ICD-10-CM

## 2022-12-13 DIAGNOSIS — M7918 Myalgia, other site: Secondary | ICD-10-CM

## 2022-12-13 DIAGNOSIS — Z8619 Personal history of other infectious and parasitic diseases: Secondary | ICD-10-CM

## 2022-12-14 NOTE — Progress Notes (Signed)
CRP and ESR remain elevated.  Patient is scheduled for an ultrasound of both hands on 12/16/22.   Aldolase is pending.

## 2022-12-15 LAB — SEDIMENTATION RATE: Sed Rate: 33 mm/h — ABNORMAL HIGH (ref 0–30)

## 2022-12-15 LAB — ALDOLASE: Aldolase: 4.6 U/L (ref <=8.1)

## 2022-12-15 LAB — C-REACTIVE PROTEIN: CRP: 9.2 mg/L — ABNORMAL HIGH (ref <8.0)

## 2022-12-16 ENCOUNTER — Ambulatory Visit: Payer: BC Managed Care – PPO | Attending: Rheumatology | Admitting: Rheumatology

## 2022-12-16 ENCOUNTER — Ambulatory Visit: Payer: BC Managed Care – PPO

## 2022-12-16 DIAGNOSIS — M19042 Primary osteoarthritis, left hand: Secondary | ICD-10-CM

## 2022-12-16 DIAGNOSIS — M19041 Primary osteoarthritis, right hand: Secondary | ICD-10-CM | POA: Diagnosis not present

## 2022-12-16 DIAGNOSIS — M79642 Pain in left hand: Secondary | ICD-10-CM

## 2022-12-16 DIAGNOSIS — M79641 Pain in right hand: Secondary | ICD-10-CM

## 2022-12-16 NOTE — Progress Notes (Signed)
Aldolase is WNL.

## 2022-12-16 NOTE — Progress Notes (Signed)
  Visit diagnosis: Pain in both hands  Ultrasound examination of bilateral hands was performed per EULAR recommendations. Using 15 MHz transducer, grayscale and power Doppler bilateral second and third MCP joints both dorsal and volar aspects were evaluated to look for synovitis or tenosynovitis. The findings were there was no synovitis or tenosynovitis on ultrasound examination.   Impression: Ultrasound examination did not show synovitis or tenosynovitis on the ultrasound examination.  Ultrasound results were discussed with the patient.  Bo Merino, MD

## 2022-12-23 NOTE — Telephone Encounter (Signed)
Recommend further evaluation by dermatology if the rash has not improved/resolved.

## 2022-12-29 ENCOUNTER — Other Ambulatory Visit: Payer: Self-pay | Admitting: Family Medicine

## 2022-12-29 ENCOUNTER — Ambulatory Visit
Admission: RE | Admit: 2022-12-29 | Discharge: 2022-12-29 | Disposition: A | Discharge status: Home / Self Care | Payer: BC Managed Care – PPO | Source: Ambulatory Visit | Attending: Family Medicine | Admitting: Family Medicine

## 2022-12-29 DIAGNOSIS — J069 Acute upper respiratory infection, unspecified: Secondary | ICD-10-CM

## 2023-01-11 NOTE — Progress Notes (Unsigned)
Office Visit Note  Patient: Stacey Flores             Date of Birth: Apr 10, 1971           MRN: [id number]             PCP: Collene Leyden, MD Referring: Kelton Pillar, MD Visit Date: 01/25/2023 Occupation: '@GUAROCC'$ @  Subjective:  Generalized pain   History of Present Illness: Stacey Flores is a 52 y.o. female with history of myofascial pain and osteoarthritis.  Patient had an ultrasound of both hands on 12/16/2022 which was negative for synovitis.  She continues to have generalized pain especially symptoms consistent with myofascial pain.  She states that she currently feels like she is having a flare.  She is having trapezius muscle tension and tenderness bilaterally.  She states that her pain level on a daily basis ranges from 2-6 out of 10.  She continues to take Cymbalta 60 mg daily.  In the past she tried gabapentin but had brain fog and drowsiness and discontinued.  She has been having difficulty sleeping at night due to nocturnal pain.  She takes Tylenol at bedtime to try to alleviate nocturnal pains.    Activities of Daily Living:  Patient reports morning stiffness for 1-2 hours.   Patient Reports nocturnal pain.  Difficulty dressing/grooming: Denies Difficulty climbing stairs: Reports Difficulty getting out of chair: Denies Difficulty using hands for taps, buttons, cutlery, and/or writing: Denies  Review of Systems  Constitutional:  Positive for fatigue.  HENT: Negative.  Negative for mouth sores and mouth dryness.   Eyes: Negative.  Negative for dryness.  Respiratory: Negative.  Negative for shortness of breath.   Cardiovascular: Negative.  Negative for chest pain and palpitations.  Gastrointestinal: Negative.  Negative for blood in stool, constipation and diarrhea.  Endocrine: Negative.  Negative for increased urination.  Genitourinary: Negative.  Negative for involuntary urination.  Musculoskeletal:  Positive for joint pain, joint pain, joint swelling, morning  stiffness and muscle tenderness. Negative for gait problem, myalgias, muscle weakness and myalgias.  Skin:  Positive for rash. Negative for color change, hair loss and sensitivity to sunlight.  Allergic/Immunologic: Negative.  Negative for susceptible to infections.  Neurological: Negative.  Negative for dizziness and headaches.  Hematological: Negative.  Negative for swollen glands.  Psychiatric/Behavioral:  Positive for sleep disturbance. Negative for depressed mood. The patient is not nervous/anxious.     PMFS History:  Patient Active Problem List   Diagnosis Date Noted   Abnormal uterine bleeding 03/15/2018    Past Medical History:  Diagnosis Date   GERD (gastroesophageal reflux disease)    HSV infection    Seasonal allergies     Family History  Problem Relation Age of Onset   Breast cancer Mother    AAA (abdominal aortic aneurysm) Father    Past Surgical History:  Procedure Laterality Date   BREAST BIOPSY Left    benign   BREAST EXCISIONAL BIOPSY     COLONOSCOPY     TOTAL LAPAROSCOPIC HYSTERECTOMY WITH SALPINGECTOMY Bilateral 03/15/2018   Procedure: TOTAL LAPAROSCOPIC HYSTERECTOMY WITH SALPINGECTOMY;  Surgeon: Janyth Pupa, DO;  Location: [facility];  Service: Gynecology;  Laterality: Bilateral;   TUBAL LIGATION     UPPER GI ENDOSCOPY     WISDOM TOOTH EXTRACTION     Social History   Social History Narrative   Not on file    There is no immunization history on file for this patient.   Objective: Vital Signs: BP 117/84 (  BP Location: Left Arm, Patient Position: Sitting, Cuff Size: Large)   Pulse 79   Resp 16   Ht '5\' 5"'$  (1.651 m)   Wt 255 lb 9.6 oz (115.9 kg)   LMP  (LMP Unknown) Comment: Cont.bleeding  BMI 42.53 kg/m    Physical Exam Vitals and nursing note reviewed.  Constitutional:      Appearance: She is well-developed.  HENT:     Head: Normocephalic and atraumatic.  Eyes:     Conjunctiva/sclera: Conjunctivae normal.  Cardiovascular:     Rate and  Rhythm: Normal rate and regular rhythm.     Heart sounds: Normal heart sounds.  Pulmonary:     Effort: Pulmonary effort is normal.     Breath sounds: Normal breath sounds.  Abdominal:     General: Bowel sounds are normal.     Palpations: Abdomen is soft.  Musculoskeletal:     Cervical back: Normal range of motion.  Skin:    General: Skin is warm and dry.     Capillary Refill: Capillary refill takes less than 2 seconds.  Neurological:     Mental Status: She is alert and oriented to person, place, and time.  Psychiatric:        Behavior: Behavior normal.      Musculoskeletal Exam: Generalized hyperalgesia and positive tender points on exam.  C-spine has limited range of motion especially with lateral rotation to the left.  Trapezius muscle tension and tenderness bilaterally.  Shoulder joints, elbow joints, wrist joints, MCPs, PIPs, DIPs have good range of motion with no synovitis.  Complete fist formation bilaterally.  Hip joints have good range of motion with no groin pain.  Some tenderness along the IT band bilaterally.  Knee joints have good range of motion with no warmth or effusion.  Ankle joints have good range of motion with no tenderness or synovitis.  No evidence of Achilles tendinitis or plantar fasciitis.  No tenderness or synovitis over MTP joints.  CDAI Exam: CDAI Score: -- Patient Global: --; Provider Global: -- Swollen: --; Tender: -- Joint Exam 01/25/2023   No joint exam has been documented for this visit   There is currently no information documented on the homunculus. Go to the Rheumatology activity and complete the homunculus joint exam.  Investigation: No additional findings.  Imaging: MM 3D SCREEN BREAST BILATERAL  Result Date: 01/18/2023 CLINICAL DATA:  Screening. EXAM: DIGITAL SCREENING BILATERAL MAMMOGRAM WITH TOMOSYNTHESIS AND CAD TECHNIQUE: Bilateral screening digital craniocaudal and mediolateral oblique mammograms were obtained. Bilateral screening  digital breast tomosynthesis was performed. The images were evaluated with computer-aided detection. COMPARISON:  Previous exam(s). ACR Breast Density Category a: The breasts are almost entirely fatty. FINDINGS: There are no findings suspicious for malignancy. IMPRESSION: No mammographic evidence of malignancy. A result letter of this screening mammogram will be mailed directly to the patient. RECOMMENDATION: Screening mammogram in one year. (Code:SM-B-01Y) BI-RADS CATEGORY  1: Negative. Electronically Signed   By: Nolon Nations M.D.   On: 01/18/2023 16:31   DG Chest 2 View  Result Date: 12/29/2022 CLINICAL DATA:  Provided history: Acute upper respiratory infection. Additional history provided: Cough and fever for 2 weeks. EXAM: CHEST - 2 VIEW COMPARISON:  No pertinent prior exams available for comparison. FINDINGS: Heart size within normal limits. No appreciable airspace consolidation. No evidence of pleural effusion or pneumothorax. No acute bony abnormality identified. IMPRESSION: No evidence of active cardiopulmonary disease. Electronically Signed   By: Kellie Simmering D.O.   On: 12/29/2022 19:21  Recent Labs: Lab Results  Component Value Date   WBC 5.6 11/03/2022   HGB 13.4 11/03/2022   PLT 286 11/03/2022   NA 140 11/03/2022   K 4.0 11/03/2022   CL 102 11/03/2022   CO2 29 11/03/2022   GLUCOSE 92 11/03/2022   BUN 18 11/03/2022   CREATININE 0.71 11/03/2022   BILITOT 0.2 11/03/2022   ALKPHOS 51 03/13/2018   AST 25 11/03/2022   ALT 20 11/03/2022   PROT 7.4 11/03/2022   ALBUMIN 3.7 03/13/2018   CALCIUM 9.6 11/03/2022   GFRAA >60 03/16/2018    Speciality Comments: No specialty comments available.  Procedures:  Trigger Point Inj  Date/Time: 01/25/2023 12:20 PM  Performed by: Ofilia Neas, PA-C Authorized by: Ofilia Neas, PA-C   Consent Given by:  Patient Site marked: the procedure site was marked   Timeout: prior to procedure the correct patient, procedure, and site was  verified   Indications:  Pain Total # of Trigger Points:  2 Location: neck   Needle Size:  27 G Approach:  Dorsal Medications #1:  0.5 mL lidocaine 1 %; 10 mg triamcinolone acetonide 40 MG/ML Medications #2:  0.5 mL lidocaine 1 %; 10 mg triamcinolone acetonide 40 MG/ML Patient tolerance:  Patient tolerated the procedure well with no immediate complications  Allergies: Patient has no known allergies.    Assessment / Plan:     Visit Diagnoses: Myofascial pain - Patient has generalized hyperalgesia and positive tender points on examination.  Discussed the diagnosis of myofascial pain syndrome/fibromyalgia syndrome.  She continues to have bouts of generalized myalgias and muscle tenderness.  According to the patient her pain level typically ranges from a 2-6 out of 10 on a daily basis.  She presents today with trapezius muscle tension and tenderness bilaterally.  She also has ongoing lateral condyle lightest of the right elbow and tenderness along the IT band bilaterally.  Different treatment options were discussed today in detail.  Discussed that she will benefit from a referral to integrative therapies.  I also discussed trying water aerobics or water therapy in the future to help minimize her symptoms and recurrence of flares.  Will treat her current trapezius muscle tension and tenderness trigger point injections were performed bilaterally.  She tolerated the procedures well.  Procedure notes were completed above.  Aftercare was discussed.  She was also given a prescription for methocarbamol 500 mg 1 tablet daily as needed for muscle spasms.  She was advised to notify us if she cannot tolerate taking methocarbamol.  She can continue on Cymbalta as prescribed.  In the past she discontinued gabapentin due to excessive drowsiness and brain fog.   Discussed the importance of regular exercise and good sleep hygiene.  She will follow-up in the office in 2 months to assess her response to integrative  therapies. plan: Ambulatory referral to Physical Therapy  Primary osteoarthritis of both hands - X-rays of both hands on 11/03/22 were consistent with early osteoarthritis.  Ultrasound of both hands was negative for synovitis on 12/16/2022.  She had no synovitis on examination today.  Discussed the diagnosis of osteoarthritis along with different treatment options.  She plans on continuing to take Tylenol at bedtime as needed for pain relief.  Positive ANA (antinuclear antibody) - 09/08/22: ANA+, RF-, ESR 33, CRP 9.11/03/2022: ANA 1:40NH, ENA-, history of fatigue, arthralgias, and hair thinning: She has no clinical features of systemic lupus at this time.  Plan on rechecking ANA in 6 months.  She was  advised to notify us if she develops any new or worsening symptoms.  Elevated sed rate: ESR was 33 on 12/13/2022.  Ultrasound of both hands negative on 12/16/2022.  No synovitis noted on examination today.  ESR can be rechecked in 3 months.  Future order placed today.  Elevated C-reactive protein (CRP): CRP was 9.2 on 12/13/2022.  Ultrasound of both hands negative on 12/16/2022.   No synovitis noted on examination. Plan to recheck in 3 months.  Future order placed today.  Chondromalacia of both patellae - X-rays of both knees on 11/03/22 are consistent with mild chondromalacia patella.  Good ROM of both knee joints.  No warmth or effusion noted today.    Primary osteoarthritis of both feet - X-rays on 11/03/22 of both feet were consistent with early osteoarthritis.  No tenderness or synovitis of MTP joints.    Lateral epicondylitis, right elbow -She has been going to physical therapy at [facility].  She continues to have persistent tenderness over the lateral condyle of the right elbow.  Plan: Ambulatory referral to Physical Therapy  Trapezius muscle spasm - She presents today with trapezius muscle tension and tenderness bilaterally.  At times she has difficulty with lateral rotation due to the  discomfort.  Different treatment options were discussed.  She requested trapezius trigger point injections today.  She tolerated the procedures well.  Procedure notes were completed above.  Aftercare was discussed.  She was also given a prescription for methocarbamol 500 mg 1 tablet daily as needed for muscle spasms.  Instructions were provided.  She was advised to notify us if she cannot tolerate taking methocarbamol.  Referral to negative therapies will be placed today.  Plan: Ambulatory referral to Physical Therapy  Cervicalgia: She has limited range of motion with lateral rotation.  Trapezius muscle tension tenderness bilaterally.  Referral to integrative therapies was placed today.  Bilateral trigger point injections were also performed today.  Other medical conditions are listed as follows:   History of diverticulosis  Hypercholesterolemia  History of esophageal dilatation  History of gastroesophageal reflux (GERD)  Rosacea  Prediabetes  History of HPV infection  HSV-2 infection    Orders: Orders Placed This Encounter  Procedures   Trigger Point Inj   Sedimentation rate   C-reactive protein   Ambulatory referral to Physical Therapy   Meds ordered this encounter  Medications   methocarbamol (ROBAXIN) 500 MG tablet    Sig: Take 1 tablet (500 mg total) by mouth daily as needed for muscle spasms.    Dispense:  30 tablet    Refill:  0     Follow-Up Instructions: Return in about 2 months (around 03/26/2023).   Ofilia Neas, PA-C  Note - This record has been created using Dragon software.  Chart creation errors have been sought, but may not always  have been located. Such creation errors do not reflect on  the standard of medical care.,

## 2023-01-17 ENCOUNTER — Ambulatory Visit
Admission: RE | Admit: 2023-01-17 | Discharge: 2023-01-17 | Disposition: A | Discharge status: Home / Self Care | Payer: BC Managed Care – PPO | Source: Ambulatory Visit | Attending: Family Medicine | Admitting: Family Medicine

## 2023-01-17 DIAGNOSIS — Z1231 Encounter for screening mammogram for malignant neoplasm of breast: Secondary | ICD-10-CM

## 2023-01-25 ENCOUNTER — Encounter: Payer: Self-pay | Admitting: Physician Assistant

## 2023-01-25 ENCOUNTER — Ambulatory Visit: Payer: BC Managed Care – PPO | Attending: Physician Assistant | Admitting: Physician Assistant

## 2023-01-25 VITALS — BP 117/84 | HR 79 | Resp 16 | Ht 65.0 in | Wt 255.6 lb

## 2023-01-25 DIAGNOSIS — Z8619 Personal history of other infectious and parasitic diseases: Secondary | ICD-10-CM

## 2023-01-25 DIAGNOSIS — R700 Elevated erythrocyte sedimentation rate: Secondary | ICD-10-CM

## 2023-01-25 DIAGNOSIS — M19042 Primary osteoarthritis, left hand: Secondary | ICD-10-CM

## 2023-01-25 DIAGNOSIS — M19041 Primary osteoarthritis, right hand: Secondary | ICD-10-CM | POA: Diagnosis not present

## 2023-01-25 DIAGNOSIS — M62838 Other muscle spasm: Secondary | ICD-10-CM | POA: Diagnosis not present

## 2023-01-25 DIAGNOSIS — R7982 Elevated C-reactive protein (CRP): Secondary | ICD-10-CM

## 2023-01-25 DIAGNOSIS — M2242 Chondromalacia patellae, left knee: Secondary | ICD-10-CM

## 2023-01-25 DIAGNOSIS — R768 Other specified abnormal immunological findings in serum: Secondary | ICD-10-CM

## 2023-01-25 DIAGNOSIS — M2241 Chondromalacia patellae, right knee: Secondary | ICD-10-CM

## 2023-01-25 DIAGNOSIS — M19071 Primary osteoarthritis, right ankle and foot: Secondary | ICD-10-CM

## 2023-01-25 DIAGNOSIS — Z9889 Other specified postprocedural states: Secondary | ICD-10-CM

## 2023-01-25 DIAGNOSIS — M542 Cervicalgia: Secondary | ICD-10-CM

## 2023-01-25 DIAGNOSIS — M7918 Myalgia, other site: Secondary | ICD-10-CM

## 2023-01-25 DIAGNOSIS — Z8719 Personal history of other diseases of the digestive system: Secondary | ICD-10-CM

## 2023-01-25 DIAGNOSIS — M7711 Lateral epicondylitis, right elbow: Secondary | ICD-10-CM

## 2023-01-25 DIAGNOSIS — E7800 Pure hypercholesterolemia, unspecified: Secondary | ICD-10-CM

## 2023-01-25 DIAGNOSIS — L719 Rosacea, unspecified: Secondary | ICD-10-CM

## 2023-01-25 DIAGNOSIS — R7303 Prediabetes: Secondary | ICD-10-CM

## 2023-01-25 DIAGNOSIS — M19072 Primary osteoarthritis, left ankle and foot: Secondary | ICD-10-CM

## 2023-01-25 DIAGNOSIS — B009 Herpesviral infection, unspecified: Secondary | ICD-10-CM

## 2023-01-25 MED ORDER — LIDOCAINE HCL 1 % IJ SOLN
0.5000 mL | INTRAMUSCULAR | Status: AC | PRN
  Administered 2023-01-25: .5 mL

## 2023-01-25 MED ORDER — TRIAMCINOLONE ACETONIDE 40 MG/ML IJ SUSP
10.0000 mg | INTRAMUSCULAR | Status: AC | PRN
  Administered 2023-01-25: 10 mg via INTRAMUSCULAR

## 2023-01-25 MED ORDER — METHOCARBAMOL 500 MG PO TABS: 500.0000 mg | ORAL_TABLET | Freq: Every day | ORAL | 0 refills | Status: DC | PRN

## 2023-01-25 NOTE — Telephone Encounter (Signed)
ESR, CK, and CRP will be rechecked in 3 months.  Future order for ESR and CRP placed today. Please place future order for CK.    ANA will be rechecked in 6 months.

## 2023-03-14 NOTE — Progress Notes (Unsigned)
Office Visit Note  Patient: Stacey Flores             Date of Birth: 01/18/1971           MRN: [id number]             PCP: Irven Coe, MD Referring: Irven Coe, MD Visit Date: 03/28/2023 Occupation: @GUAROCC @  Subjective:  Left shoulder pain   History of Present Illness: Stacey Flores is a 52 y.o. female with history of myofascial pain and osteoarthritis.  Patient was referred to integrative therapies after her last office visit and states that she has noticed a significant improvement in her generalized myofascial pain.  She states that the lateral epicondylitis of her right elbow has resolved.  She started to notice some alleviation of the trapezius muscle tension and tenderness she was experiencing.  She found the trapezius trigger point injections at her last office visit to be helpful at alleviating some of the muscle tenderness and tension she was experiencing.  She continues to have persistent pain in the left shoulder and has noticed some restriction in range of motion.  She has been taking methocarbamol 500 mg 1 tablet daily as needed for muscle spasms and during flares.  She remains on Cymbalta as prescribed.   Activities of Daily Living:  Patient reports morning stiffness for 1 hour.   Patient Reports nocturnal pain.  Difficulty dressing/grooming: Denies Difficulty climbing stairs: Reports Difficulty getting out of chair: Denies Difficulty using hands for taps, buttons, cutlery, and/or writing: Denies  Review of Systems  Constitutional:  Positive for fatigue.  HENT:  Negative for mouth sores and mouth dryness.   Eyes:  Negative for dryness.  Respiratory:  Negative for shortness of breath.   Cardiovascular:  Negative for chest pain and palpitations.  Gastrointestinal:  Negative for blood in stool, constipation and diarrhea.  Endocrine: Negative for increased urination.  Genitourinary:  Negative for involuntary urination.  Musculoskeletal:  Positive for joint pain,  joint pain, myalgias, morning stiffness, muscle tenderness and myalgias. Negative for gait problem, joint swelling and muscle weakness.  Skin:  Positive for sensitivity to sunlight. Negative for color change, rash and hair loss.  Allergic/Immunologic: Negative for susceptible to infections.  Neurological:  Positive for headaches. Negative for dizziness.  Hematological:  Negative for swollen glands.  Psychiatric/Behavioral:  Positive for sleep disturbance. Negative for depressed mood. The patient is not nervous/anxious.     PMFS History:  Patient Active Problem List   Diagnosis Date Noted   Abnormal uterine bleeding 03/15/2018    Past Medical History:  Diagnosis Date   GERD (gastroesophageal reflux disease)    HSV infection    Seasonal allergies     Family History  Problem Relation Age of Onset   Breast cancer Mother    AAA (abdominal aortic aneurysm) Father    Past Surgical History:  Procedure Laterality Date   BREAST BIOPSY Left    benign   BREAST EXCISIONAL BIOPSY     COLONOSCOPY     TOTAL LAPAROSCOPIC HYSTERECTOMY WITH SALPINGECTOMY Bilateral 03/15/2018   Procedure: TOTAL LAPAROSCOPIC HYSTERECTOMY WITH SALPINGECTOMY;  Surgeon: Myna Hidalgo, DO;  Location: [facility];  Service: Gynecology;  Laterality: Bilateral;   TUBAL LIGATION     UPPER GI ENDOSCOPY     WISDOM TOOTH EXTRACTION     Social History   Social History Narrative   Not on file    There is no immunization history on file for this patient.   Objective: Vital Signs:  BP 129/83 (BP Location: Left Arm, Patient Position: Sitting, Cuff Size: Large)   Pulse 84   Resp 17   Ht 5\' 4"  (1.626 m)   Wt 255 lb 3.2 oz (115.8 kg)   LMP  (LMP Unknown) Comment: Cont.bleeding  BMI 43.80 kg/m    Physical Exam Vitals and nursing note reviewed.  Constitutional:      Appearance: She is well-developed.  HENT:     Head: Normocephalic and atraumatic.  Eyes:     Conjunctiva/sclera: Conjunctivae normal.  Cardiovascular:      Rate and Rhythm: Normal rate and regular rhythm.     Heart sounds: Normal heart sounds.  Pulmonary:     Effort: Pulmonary effort is normal.     Breath sounds: Normal breath sounds.  Abdominal:     General: Bowel sounds are normal.     Palpations: Abdomen is soft.  Musculoskeletal:     Cervical back: Normal range of motion.  Lymphadenopathy:     Cervical: No cervical adenopathy.  Skin:    General: Skin is warm and dry.     Capillary Refill: Capillary refill takes less than 2 seconds.  Neurological:     Mental Status: She is alert and oriented to person, place, and time.  Psychiatric:        Behavior: Behavior normal.      Musculoskeletal Exam: C-spine has good ROM with some discomfort with lateral rotation.  Left trapezius muscle tension and tenderness.  Right should has good ROM with no discomfort.  Left shoulder has limited range of motion with internal rotation and full abduction.  Tenderness over the left subacromial bursa. Elbow joints, wrist joints, MCPs, PIPs, and DIPs good ROM with no synovitis.  Complete fist formation bilaterally.  Hip joints have good ROM with no groin pain.  No tenderness over trochanteric bursa at this time.  Knee joints have good ROM with no warmth or effusion.  Ankle joints have good ROM with no tenderness or joint swelling.   CDAI Exam: CDAI Score: -- Patient Global: --; Provider Global: -- Swollen: --; Tender: -- Joint Exam 03/28/2023   No joint exam has been documented for this visit   There is currently no information documented on the homunculus. Go to the Rheumatology activity and complete the homunculus joint exam.  Investigation: No additional findings.  Imaging: XR Shoulder Left  Result Date: 03/28/2023 No glenohumeral or acromioclavicular joint space narrowing was noted.  No chondrocalcinosis was noted. Impression: Unremarkable x-rays of the shoulder.   Recent Labs: Lab Results  Component Value Date   WBC 5.6 11/03/2022   HGB  13.4 11/03/2022   PLT 286 11/03/2022   NA 140 11/03/2022   K 4.0 11/03/2022   CL 102 11/03/2022   CO2 29 11/03/2022   GLUCOSE 92 11/03/2022   BUN 18 11/03/2022   CREATININE 0.71 11/03/2022   BILITOT 0.2 11/03/2022   ALKPHOS 51 03/13/2018   AST 25 11/03/2022   ALT 20 11/03/2022   PROT 7.4 11/03/2022   ALBUMIN 3.7 03/13/2018   CALCIUM 9.6 11/03/2022   GFRAA >60 03/16/2018    Speciality Comments: No specialty comments available.  Procedures:  Large Joint Inj: L subacromial bursa on 03/28/2023 10:13 AM Indications: pain Details: 27 G 1.5 in needle, posterior approach  Arthrogram: No  Medications: 1 mL lidocaine 1 %; 40 mg triamcinolone acetonide 40 MG/ML Aspirate: 0 mL Outcome: tolerated well, no immediate complications Procedure, treatment alternatives, risks and benefits explained, specific risks discussed. Consent was given by  the patient. Immediately prior to procedure a time out was called to verify the correct patient, procedure, equipment, support staff and site/side marked as required. Patient was prepped and draped in the usual sterile fashion.     Allergies: Patient has no known allergies.   Assessment / Plan:     Visit Diagnoses: Myofascial pain: She was referred to integrative therapies after her last office visit and has noticed a significant improvement in the generalized myofascial pain she was experiencing.  The right elbow lateral epicondylitis has resolved.  She has noticed some alleviation of trapezius muscle tension and tenderness since undergoing trigger point injections on 01/25/2023.  She remains on Cymbalta as prescribed and has been taking methocarbamol 500 mg 1 tablet daily as needed during flares which has been helpful.  She plans on continuing to go to integrative therapies twice a week.  Discussed the forms of regular exercise and good sleep hygiene.  Primary osteoarthritis of both hands - X-rays of both hands on 11/03/22 were consistent with early  osteoarthritis.  Ultrasound of both hands was negative for synovitis on 12/16/2022.  No tenderness or synovitis noted today.  Complete fist formation bilaterally.  Chronic left shoulder pain - Patient presents today with ongoing pain and stiffness in the left shoulder joint.  She has been going to integrative therapies and has undergone stretching, strengthening, and several therapeutic exercises but has had persistent discomfort.  X-rays of the left shoulder were obtained today for further evaluation.  Different treatment options were discussed.  After informed consent the left subacromial bursa was injected with cortisone today.  She tolerated the procedure well.  Procedure note was completed above.  Aftercare was discussed.  Plan: XR Shoulder Left  Positive ANA (antinuclear antibody) - 09/08/22: ANA+, RF-, ESR 33, CRP 9.11/03/2022: ANA 1:40NH, ENA-, history of fatigue, arthralgias, and hair thinning: She has no clinical features of systemic lupus at this time.  Plan on updating autoimmune lab work at her next follow-up visit including ANA, complements, double-stranded DNA, and protein creatinine ratio.   Elevated sed rate - ESR was 33 on 12/13/2022.  Ultrasound of both hands negative on 12/16/2022. ESR and CRP will be rechecked today.- Plan: Sedimentation rate, C-reactive protein  Elevated C-reactive protein (CRP) - CRP was 9.2 on 12/13/2022.  Ultrasound of both hands negative on 12/16/2022.  ESR and CRP will be updated today.- Plan: Sedimentation rate, C-reactive protein  Chondromalacia of both patellae - X-rays of both knees on 11/03/22 are consistent with mild chondromalacia patella.  Good range of motion of both knee joints on examination today.  No warmth or effusion noted.  Primary osteoarthritis of both feet - X-rays on 11/03/22 of both feet were consistent with early osteoarthritis.  She continues to experience intermittent discomfort in both feet but wears proper fitting shoes.  She has good range  of motion of both ankle joints on examination today.  No warmth or effusion noted.  Lateral epicondylitis, right elbow: Resolved since going to integrative therapies.  No tenderness upon palpation today.  Trapezius muscle spasm: She underwent trapezius trigger point injections on 01/25/2023, which were helpful.   Cervicalgia: MRI of C-spine 03/12/21: mild cervical disc degeneration most notable at C5-C6 where there is mild spinal stenosis.  Small right paracentral disc protrusion at C5-C6 without stenosis noted. C-spine has good ROM with some discomfort with lateral rotation especially to the left.  No symptoms of radiculopathy at this time.   Other medical conditions are listed as follows:   Hypercholesterolemia  History of diverticulosis  Prediabetes  Rosacea  History of gastroesophageal reflux (GERD)  HSV-2 infection  History of esophageal dilatation  History of HPV infection   Orders: Orders Placed This Encounter  Procedures   Large Joint Inj: L subacromial bursa   XR Shoulder Left   Sedimentation rate   C-reactive protein   No orders of the defined types were placed in this encounter.     Follow-Up Instructions: Return in about 3 months (around 06/27/2023) for Osteoarthritis, Myofascial pain.   Gearldine Bienenstock, PA-C  Note - This record has been created using Dragon software.  Chart creation errors have been sought, but may not always  have been located. Such creation errors do not reflect on  the standard of medical care.

## 2023-03-28 ENCOUNTER — Ambulatory Visit: Payer: BC Managed Care – PPO | Attending: Physician Assistant | Admitting: Physician Assistant

## 2023-03-28 ENCOUNTER — Ambulatory Visit (INDEPENDENT_AMBULATORY_CARE_PROVIDER_SITE_OTHER): Payer: BC Managed Care – PPO

## 2023-03-28 ENCOUNTER — Encounter: Payer: Self-pay | Admitting: Physician Assistant

## 2023-03-28 VITALS — BP 129/83 | HR 84 | Resp 17 | Ht 64.0 in | Wt 255.2 lb

## 2023-03-28 DIAGNOSIS — M7918 Myalgia, other site: Secondary | ICD-10-CM

## 2023-03-28 DIAGNOSIS — G8929 Other chronic pain: Secondary | ICD-10-CM

## 2023-03-28 DIAGNOSIS — M7711 Lateral epicondylitis, right elbow: Secondary | ICD-10-CM

## 2023-03-28 DIAGNOSIS — Z8619 Personal history of other infectious and parasitic diseases: Secondary | ICD-10-CM

## 2023-03-28 DIAGNOSIS — M25512 Pain in left shoulder: Secondary | ICD-10-CM | POA: Diagnosis not present

## 2023-03-28 DIAGNOSIS — R700 Elevated erythrocyte sedimentation rate: Secondary | ICD-10-CM

## 2023-03-28 DIAGNOSIS — M62838 Other muscle spasm: Secondary | ICD-10-CM

## 2023-03-28 DIAGNOSIS — R7982 Elevated C-reactive protein (CRP): Secondary | ICD-10-CM

## 2023-03-28 DIAGNOSIS — M542 Cervicalgia: Secondary | ICD-10-CM

## 2023-03-28 DIAGNOSIS — E7800 Pure hypercholesterolemia, unspecified: Secondary | ICD-10-CM

## 2023-03-28 DIAGNOSIS — M19041 Primary osteoarthritis, right hand: Secondary | ICD-10-CM

## 2023-03-28 DIAGNOSIS — L719 Rosacea, unspecified: Secondary | ICD-10-CM

## 2023-03-28 DIAGNOSIS — Z8719 Personal history of other diseases of the digestive system: Secondary | ICD-10-CM

## 2023-03-28 DIAGNOSIS — M2241 Chondromalacia patellae, right knee: Secondary | ICD-10-CM

## 2023-03-28 DIAGNOSIS — Z9889 Other specified postprocedural states: Secondary | ICD-10-CM

## 2023-03-28 DIAGNOSIS — M19042 Primary osteoarthritis, left hand: Secondary | ICD-10-CM

## 2023-03-28 DIAGNOSIS — R768 Other specified abnormal immunological findings in serum: Secondary | ICD-10-CM

## 2023-03-28 DIAGNOSIS — M19072 Primary osteoarthritis, left ankle and foot: Secondary | ICD-10-CM

## 2023-03-28 DIAGNOSIS — M19071 Primary osteoarthritis, right ankle and foot: Secondary | ICD-10-CM

## 2023-03-28 DIAGNOSIS — R7303 Prediabetes: Secondary | ICD-10-CM

## 2023-03-28 DIAGNOSIS — B009 Herpesviral infection, unspecified: Secondary | ICD-10-CM

## 2023-03-28 DIAGNOSIS — M2242 Chondromalacia patellae, left knee: Secondary | ICD-10-CM

## 2023-03-28 MED ORDER — TRIAMCINOLONE ACETONIDE 40 MG/ML IJ SUSP
40.0000 mg | INTRAMUSCULAR | Status: AC | PRN
  Administered 2023-03-28: 40 mg via INTRA_ARTICULAR

## 2023-03-28 MED ORDER — LIDOCAINE HCL 1 % IJ SOLN
1.0000 mL | INTRAMUSCULAR | Status: AC | PRN
  Administered 2023-03-28: 1 mL

## 2023-03-29 ENCOUNTER — Ambulatory Visit: Payer: BC Managed Care – PPO | Admitting: Physician Assistant

## 2023-03-29 LAB — C-REACTIVE PROTEIN: CRP: 8.5 mg/L — ABNORMAL HIGH (ref <8.0)

## 2023-03-29 LAB — SEDIMENTATION RATE: Sed Rate: 38 mm/h — ABNORMAL HIGH (ref 0–30)

## 2023-03-29 NOTE — Progress Notes (Signed)
ESR remains elevated-stable. CRP is borderline elevated but continues to trend down.

## 2023-05-24 ENCOUNTER — Other Ambulatory Visit: Payer: Self-pay | Admitting: Physician Assistant

## 2023-05-25 NOTE — Telephone Encounter (Signed)
Last Fill: 01/25/2023  Next Visit: 06/30/2023  Last Visit: 03/28/2023  Dx: Myofascial pain   Current Dose per office note on 03/28/2023: methocarbamol 500 mg 1 tablet daily   Okay to refill Methocarbamol?

## 2023-06-16 NOTE — Progress Notes (Signed)
Office Visit Note  Patient: Stacey Flores             Date of Birth: 05/23/1971           MRN: [id number]             PCP: Irven Coe, MD Referring: Irven Coe, MD Visit Date: 06/30/2023 Occupation: @GUAROCC @  Subjective:  Medication management  History of Present Illness: Stacey Flores is a 52 y.o. female with osteoarthritis and myofascial pain syndrome.  She states she started having lower back pain on July 9.  She started going for physical therapy and gradually the lower back pain started improving.  She states 2 weeks ago she fell and bruised her shin area and also jarred her lower back which has been causing some discomfort.  She has been going to integrative therapies for the last months which has been helpful.  She has noticed improvement in the trapezius discomfort and also over the lateral epicondylitis.  She had a left shoulder joint injection at the last visit in April which helped her left shoulder joint discomfort.  She continues to have some stiffness in her hands and her feet.  She continues to take Cymbalta 30 mg p.o. twice daily for myofascial pain and also takes Robaxin only on as needed basis.  She is not taking any NSAIDs.    Activities of Daily Living:  Patient reports morning stiffness for less than 1 hour.   Patient Reports nocturnal pain.  Difficulty dressing/grooming: Denies Difficulty climbing stairs: Denies Difficulty getting out of chair: Denies Difficulty using hands for taps, buttons, cutlery, and/or writing: Reports  Review of Systems  Constitutional:  Positive for fatigue.  HENT:  Negative for mouth sores and mouth dryness.   Eyes:  Negative for dryness.  Respiratory:  Negative for shortness of breath.   Cardiovascular:  Negative for chest pain and palpitations.  Gastrointestinal:  Negative for blood in stool, constipation and diarrhea.  Endocrine: Negative for increased urination.  Genitourinary:  Negative for involuntary urination.   Musculoskeletal:  Positive for myalgias, morning stiffness, muscle tenderness and myalgias. Negative for joint pain, gait problem, joint pain, joint swelling and muscle weakness.  Skin:  Negative for color change, rash, hair loss and sensitivity to sunlight.  Allergic/Immunologic: Negative for susceptible to infections.  Neurological:  Positive for headaches. Negative for dizziness.  Hematological:  Negative for swollen glands.  Psychiatric/Behavioral:  Positive for sleep disturbance. Negative for depressed mood. The patient is not nervous/anxious.     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   History of esophageal dilatation 06/30/2023   Abnormal uterine bleeding 03/15/2018    Past Medical History:  Diagnosis Date   GERD (gastroesophageal reflux disease)    HSV infection    Seasonal allergies     Family History  Problem Relation Age of Onset   Breast cancer Mother    AAA (abdominal aortic aneurysm) Father    Past Surgical History:  Procedure Laterality Date   BREAST BIOPSY Left    benign   BREAST EXCISIONAL BIOPSY     COLONOSCOPY     TOTAL LAPAROSCOPIC HYSTERECTOMY WITH SALPINGECTOMY Bilateral 03/15/2018  Procedure: TOTAL LAPAROSCOPIC HYSTERECTOMY WITH SALPINGECTOMY;  Surgeon: Myna Hidalgo, DO;  Location: [facility];  Service: Gynecology;  Laterality: Bilateral;   TUBAL LIGATION     UPPER GI ENDOSCOPY     WISDOM TOOTH EXTRACTION     Social History   Social History Narrative   Not on file    There is no immunization history on file for this patient.   Objective: Vital Signs: BP 118/85 (BP Location: Left Arm, Patient  Position: Sitting, Cuff Size: Normal)   Pulse 97   Resp 17   Ht 5\' 5"  (1.651 m)   Wt 261 lb 3.2 oz (118.5 kg)   LMP  (LMP Unknown) Comment: Cont.bleeding  BMI 43.47 kg/m    Physical Exam Vitals and nursing note reviewed.  Constitutional:      Appearance: She is well-developed.  HENT:     Head: Normocephalic and atraumatic.  Eyes:     Conjunctiva/sclera: Conjunctivae normal.  Cardiovascular:     Rate and Rhythm: Normal rate and regular rhythm.     Heart sounds: Normal heart sounds.  Pulmonary:     Effort: Pulmonary effort is normal.     Breath sounds: Normal breath sounds.  Abdominal:     General: Bowel sounds are normal.     Palpations: Abdomen is soft.  Musculoskeletal:     Cervical back: Normal range of motion.  Lymphadenopathy:     Cervical: No cervical adenopathy.  Skin:    General: Skin is warm and dry.     Capillary Refill: Capillary refill takes less than 2 seconds.  Neurological:     Mental Status: She is alert and oriented to person, place, and time.  Psychiatric:        Behavior: Behavior normal.      Musculoskeletal Exam: Cervical, thoracic and lumbar spine were in good range of motion.  She had bilateral trapezius spasm.  Shoulder joints were in good range of motion without any discomfort.  Elbow joints in good range of motion.  There was no tenderness over wrist joints, MCPs PIPs and DIPs.  Hip joints and knee joints were in good range of motion.  There was no tenderness over ankles or MTPs.  CDAI Exam: CDAI Score: -- Patient Global: --; Provider Global: -- Swollen: --; Tender: -- Joint Exam 06/30/2023   No joint exam has been documented for this visit   There is currently no information documented on the homunculus. Go to the Rheumatology activity and complete the homunculus joint exam.  Investigation: No additional findings.  Imaging: No results found.  Recent Labs: Lab Results  Component Value Date   WBC 5.6 11/03/2022   HGB 13.4  11/03/2022   PLT 286 11/03/2022   NA 140 11/03/2022   K 4.0 11/03/2022   CL 102 11/03/2022   CO2 29 11/03/2022   GLUCOSE 92 11/03/2022   BUN 18 11/03/2022   CREATININE 0.71 11/03/2022   BILITOT 0.2 11/03/2022   ALKPHOS 51 03/13/2018   AST 25 11/03/2022   ALT 20 11/03/2022   PROT 7.4 11/03/2022   ALBUMIN 3.7 03/13/2018   CALCIUM 9.6 11/03/2022   GFRAA >60 03/16/2018    Speciality Comments: No specialty comments available.  Procedures:  No procedures performed Allergies: Patient has no known allergies.   Assessment / Plan:     Visit Diagnoses: Myofascial pain -she continues to have some generalized pain and discomfort.  She states pain has much improved since she has been going to integrative therapies.  She takes  methocarbamol 500 mg 1 tablet daily as needed during flares.  She remains on Cymbalta 30 mg p.o. twice daily which has been helpful to relieve pain.  Benefits of water aerobics, so and stretching were discussed.  Patient states she had been driving about 60 miles a day back and forth to her work which has been hard on her joints and spine.  She should be relocating to [facility] in August which will reduce her commute time to only 15 minutes.  Primary osteoarthritis of both hands -she continues to have some stiffness in her hands.  No synovitis was noted.  X-rays of both hands on 11/03/22 were consistent with early osteoarthritis.  Ultrasound of both hands was negative for synovitis on 12/16/2022.  Chronic left shoulder pain-she had left subacromial bursa injection at the last visit on March 28, 2023 with good response.  She good range of motion of her shoulder joints without discomfort.  Positive ANA (antinuclear antibody) -ANA is low titer and not significant.  She has no clinical features of autoimmune disease.  09/08/22: ANA+, RF-, ESR 33, CRP 9.11/03/2022: ANA 1:40NH, ENA-, history of fatigue, arthralgias, and hair thinning in the past.Patient denies any history of oral  ulcers, nasal ulcers, sicca symptoms, malar rash, photosensitivity, Raynaud's or lymphadenopathy.  Elevated sed rate - ESR was 33 on 12/13/2022.  Ultrasound of both hands negative on 12/16/2022.  Elevated C-reactive protein (CRP) - CRP was 9.2 on 12/13/2022.  Ultrasound of both hands negative on 12/16/2022.  Chondromalacia of both patellae -she has intermittent discomfort and stiffness in her knee joints.  No warmth swelling or effusion was noted.  Lower extremity muscle strength exercises were discussed.  X-rays of both knees on 11/03/22 are consistent with mild chondromalacia patella.  Primary osteoarthritis of both feet -proper fitting shoes were advised.  X-rays on 11/03/22 of both feet were consistent with early osteoarthritis.  Trapezius muscle spasm -she had mild tenderness over the trapezius region.  She states the spasm has improved since she has been going to integrative therapies.  She underwent trapezius trigger point injections on 01/25/2023, which were helpful.  Cervicalgia - MRI of C-spine 03/12/21: She had mild generative disc disease most notable at C5-6 with mild spinal stenosis.  She also had a small right paracentral disc protrusion at C6 5 without stenosis.  She has intermittent discomfort in the cervical spine with trapezius spasm.  Other medical problems are listed as follows:  Hypercholesterolemia  History of diverticulosis  Rosacea  Prediabetes  History of gastroesophageal reflux (GERD)  HSV-2 infection  History of HPV infection  History of esophageal dilatation  Orders: No orders of the defined types were placed in this encounter.  No orders of the defined types were placed in this encounter.    Follow-Up Instructions: Return in about 6 months (around 12/31/2023) for Osteoarthritis.   Pollyann Savoy, MD  Note - This record has been created using Animal nutritionist.  Chart creation errors have been sought, but may not always  have been located. Such  creation errors do not reflect on  the standard of medical care.

## 2023-06-30 ENCOUNTER — Ambulatory Visit: Payer: BC Managed Care – PPO | Attending: Rheumatology | Admitting: Rheumatology

## 2023-06-30 ENCOUNTER — Encounter: Payer: Self-pay | Admitting: Rheumatology

## 2023-06-30 VITALS — BP 118/85 | HR 97 | Resp 17 | Ht 65.0 in | Wt 261.2 lb

## 2023-06-30 DIAGNOSIS — R700 Elevated erythrocyte sedimentation rate: Secondary | ICD-10-CM

## 2023-06-30 DIAGNOSIS — R7982 Elevated C-reactive protein (CRP): Secondary | ICD-10-CM

## 2023-06-30 DIAGNOSIS — M62838 Other muscle spasm: Secondary | ICD-10-CM

## 2023-06-30 DIAGNOSIS — Z8719 Personal history of other diseases of the digestive system: Secondary | ICD-10-CM

## 2023-06-30 DIAGNOSIS — M7711 Lateral epicondylitis, right elbow: Secondary | ICD-10-CM

## 2023-06-30 DIAGNOSIS — M19041 Primary osteoarthritis, right hand: Secondary | ICD-10-CM

## 2023-06-30 DIAGNOSIS — M19071 Primary osteoarthritis, right ankle and foot: Secondary | ICD-10-CM

## 2023-06-30 DIAGNOSIS — Z8619 Personal history of other infectious and parasitic diseases: Secondary | ICD-10-CM

## 2023-06-30 DIAGNOSIS — B009 Herpesviral infection, unspecified: Secondary | ICD-10-CM | POA: Insufficient documentation

## 2023-06-30 DIAGNOSIS — M7918 Myalgia, other site: Secondary | ICD-10-CM

## 2023-06-30 DIAGNOSIS — R7303 Prediabetes: Secondary | ICD-10-CM

## 2023-06-30 DIAGNOSIS — M2242 Chondromalacia patellae, left knee: Secondary | ICD-10-CM

## 2023-06-30 DIAGNOSIS — Z9889 Other specified postprocedural states: Secondary | ICD-10-CM

## 2023-06-30 DIAGNOSIS — R7689 Other specified abnormal immunological findings in serum: Secondary | ICD-10-CM

## 2023-06-30 DIAGNOSIS — M2241 Chondromalacia patellae, right knee: Secondary | ICD-10-CM

## 2023-06-30 DIAGNOSIS — M542 Cervicalgia: Secondary | ICD-10-CM

## 2023-06-30 DIAGNOSIS — M25512 Pain in left shoulder: Secondary | ICD-10-CM | POA: Diagnosis not present

## 2023-06-30 DIAGNOSIS — R768 Other specified abnormal immunological findings in serum: Secondary | ICD-10-CM | POA: Diagnosis not present

## 2023-06-30 DIAGNOSIS — G8929 Other chronic pain: Secondary | ICD-10-CM

## 2023-06-30 DIAGNOSIS — E7800 Pure hypercholesterolemia, unspecified: Secondary | ICD-10-CM

## 2023-06-30 DIAGNOSIS — L719 Rosacea, unspecified: Secondary | ICD-10-CM

## 2023-06-30 DIAGNOSIS — M19072 Primary osteoarthritis, left ankle and foot: Secondary | ICD-10-CM

## 2023-06-30 DIAGNOSIS — M19042 Primary osteoarthritis, left hand: Secondary | ICD-10-CM

## 2023-06-30 HISTORY — DX: Prediabetes: R73.03

## 2023-06-30 HISTORY — DX: Primary osteoarthritis, right hand: M19.041

## 2023-06-30 HISTORY — DX: Rosacea, unspecified: L71.9

## 2023-06-30 HISTORY — DX: Primary osteoarthritis, right ankle and foot: M19.071

## 2023-06-30 HISTORY — DX: Personal history of other diseases of the digestive system: Z87.19

## 2023-06-30 HISTORY — DX: Other specified postprocedural states: Z98.890

## 2023-06-30 HISTORY — DX: Chondromalacia patellae, right knee: M22.41

## 2023-06-30 HISTORY — DX: Pure hypercholesterolemia, unspecified: E78.00

## 2023-06-30 HISTORY — DX: Myalgia, other site: M79.18

## 2023-09-16 LAB — LAB REPORT - SCANNED: EGFR: 105

## 2023-09-21 ENCOUNTER — Telehealth: Payer: Self-pay | Admitting: *Deleted

## 2023-09-21 NOTE — Telephone Encounter (Signed)
Labs received from: [place] at [facility] on: 09/16/2023  Reviewed by: Sherron Ales, PA-C  Labs drawn:CRP, CBC, CMP, Sed Rate, Lipid Panel  Results: Sed Rate 38     Cholesterol 245    Chol/HDL 4.1    NHDL 185    LDL Chol Calc 161  Per Taylor, CRP WNL (improved), ESR 38 (unchanged since 03/28/2023)

## 2023-11-02 IMAGING — MG MM DIGITAL SCREENING BILAT W/ TOMO AND CAD
6 of 10 series · 6 of 30 positions shown · non-contrast
Comparison: Previous exam(s).

ACR Breast Density Category a: The breast tissue is almost entirely
fatty.

CLINICAL DATA: Screening.

EXAM:
DIGITAL SCREENING BILATERAL MAMMOGRAM WITH TOMOSYNTHESIS AND CAD
TECHNIQUE: Bilateral screening digital craniocaudal and mediolateral oblique
mammograms were obtained. Bilateral screening digital breast
tomosynthesis was performed. The images were evaluated with
computer-aided detection.

[R CC synth-2D]
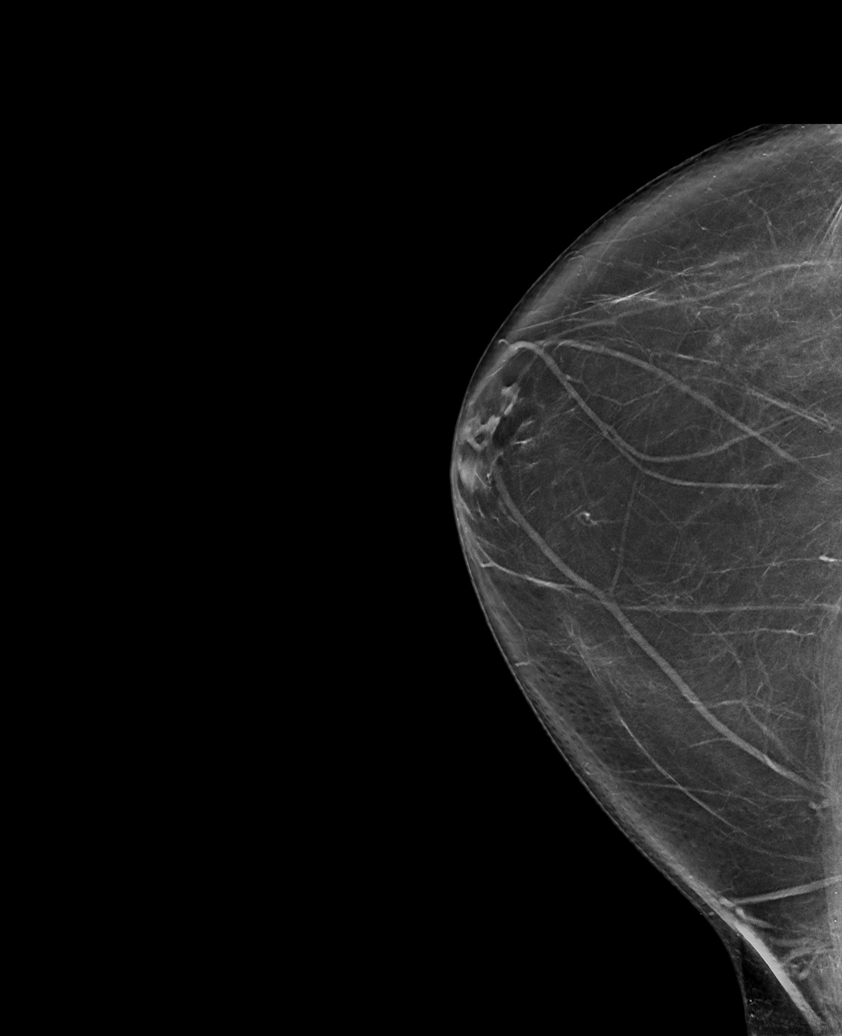

[L CC synth-2D]
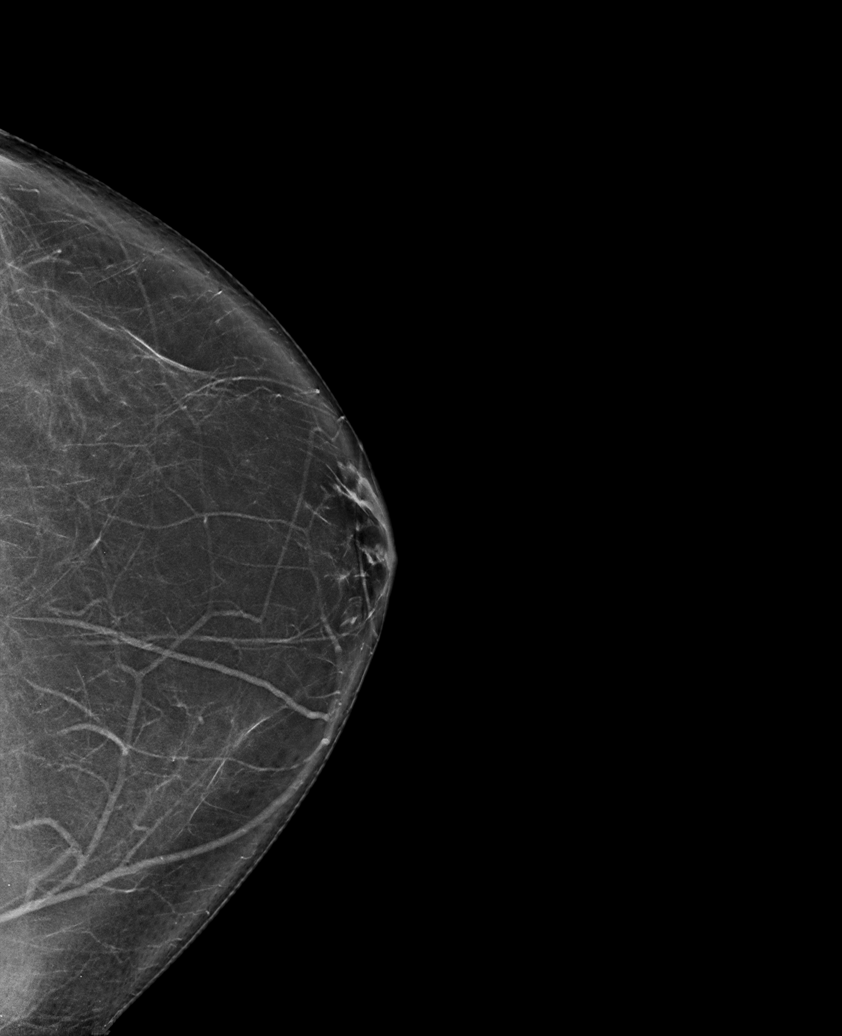

[R MLO synth-2D]
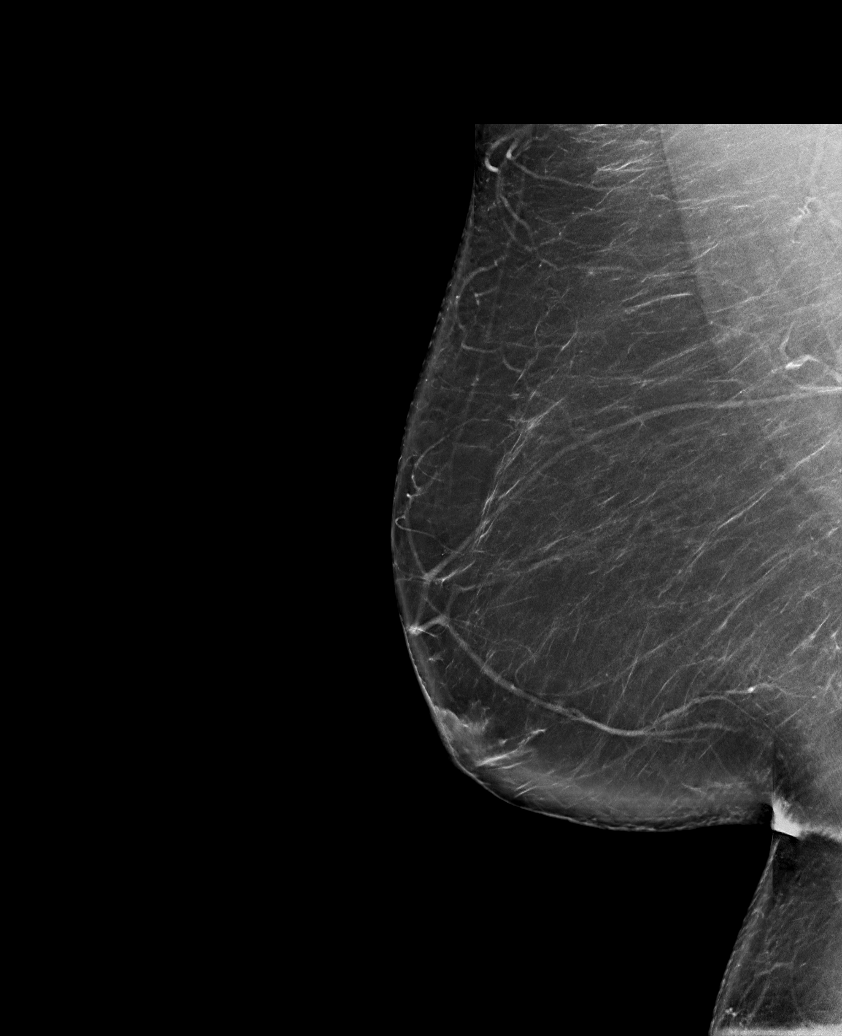

[L CV synth-2D]
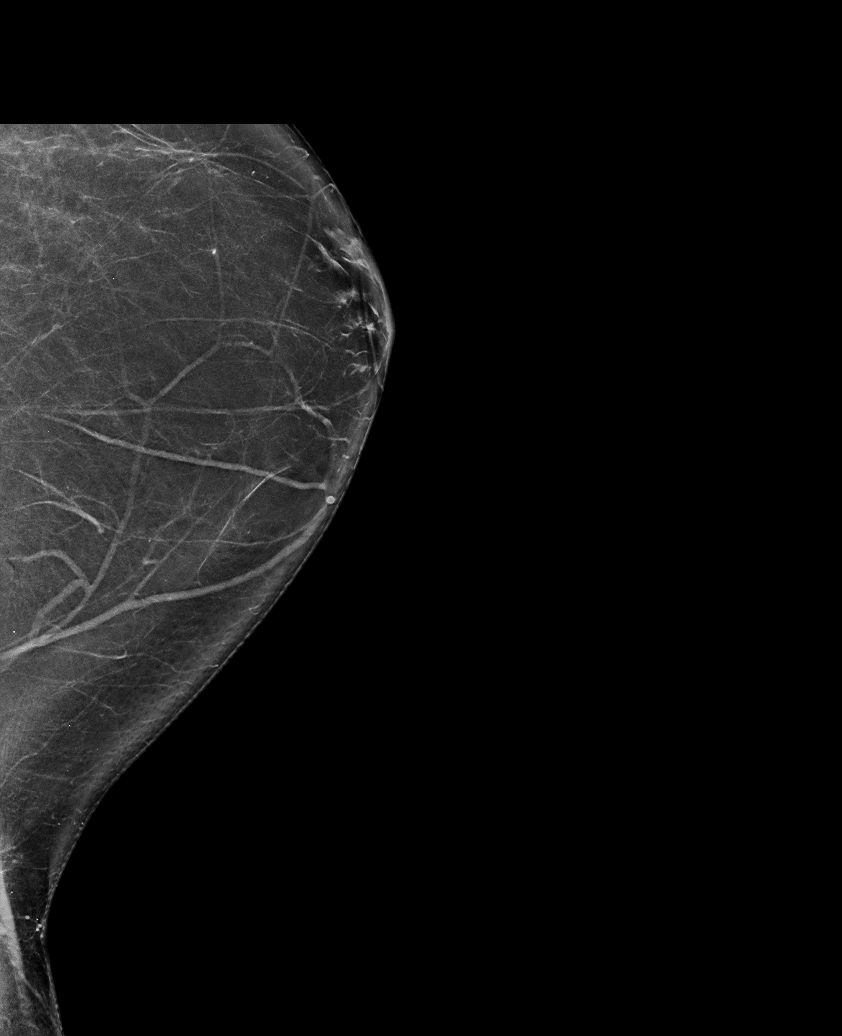

[L MLO synth-2D]
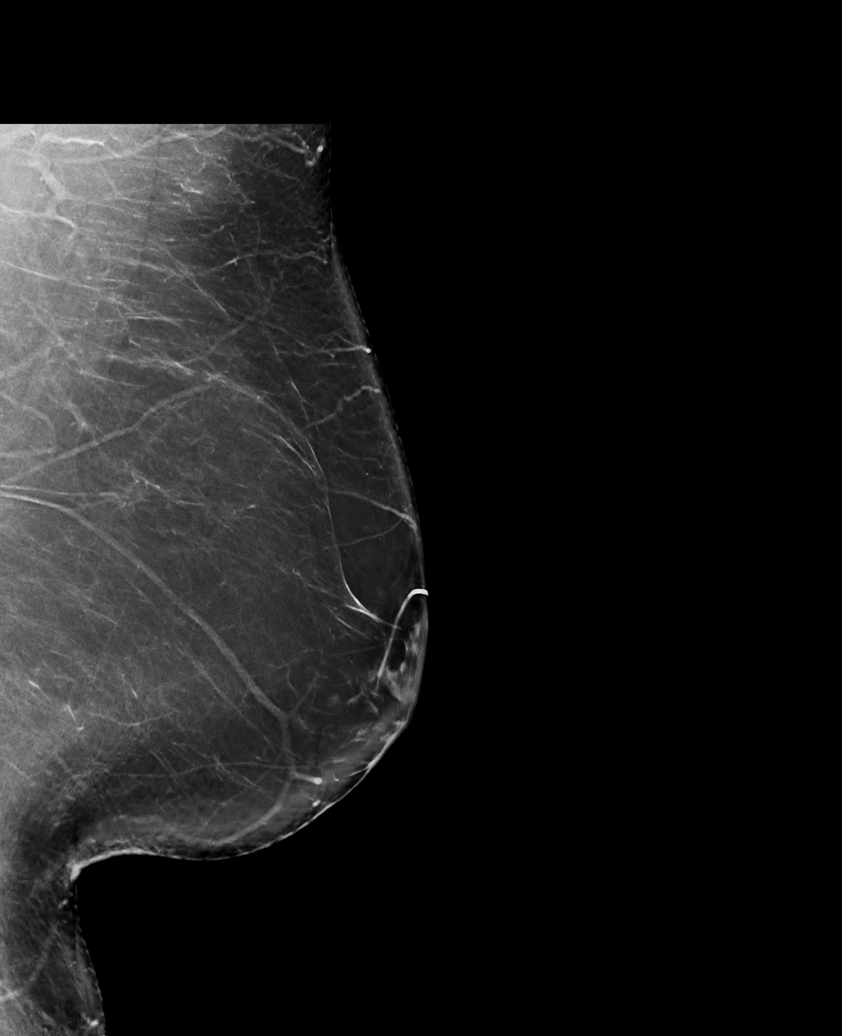

[R CC tomo · tomo slice 43/86.0]
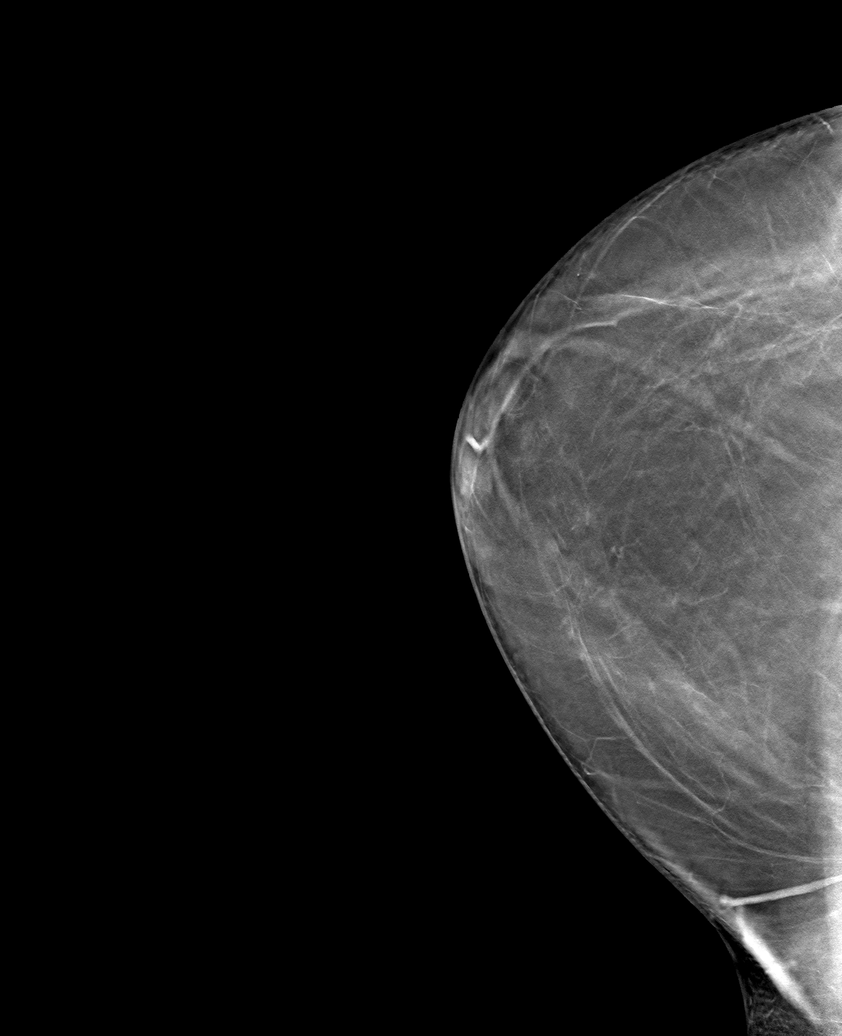

[6 of 30 positions shown; findings below may reference images not displayed]

FINDINGS: There are no findings suspicious for malignancy.
IMPRESSION: No mammographic evidence of malignancy. A result letter of this
screening mammogram will be mailed directly to the patient.

RECOMMENDATION:
Screening mammogram in one year. (Code:0E-3-N98)

BI-RADS CATEGORY  1: Negative.

## 2023-12-19 NOTE — Progress Notes (Deleted)
 Office Visit Note  Patient: Stacey Flores             Date of Birth: 10/16/1971           MRN: [id number]             PCP: Irven Coe, MD Referring: Irven Coe, MD Visit Date: 01/02/2024 Occupation: @GUAROCC @  Subjective:    History of Present Illness: Stacey Flores is a 53 y.o. female with history of myofascial pain and osteoarthritis.     Cymbalta robaxin  Activities of Daily Living:  Patient reports morning stiffness for *** {minute/hour:19697}.   Patient {ACTIONS;DENIES/REPORTS:21021675::"Denies"} nocturnal pain.  Difficulty dressing/grooming: {ACTIONS;DENIES/REPORTS:21021675::"Denies"} Difficulty climbing stairs: {ACTIONS;DENIES/REPORTS:21021675::"Denies"} Difficulty getting out of chair: {ACTIONS;DENIES/REPORTS:21021675::"Denies"} Difficulty using hands for taps, buttons, cutlery, and/or writing: {ACTIONS;DENIES/REPORTS:21021675::"Denies"}  No Rheumatology ROS completed.   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   History of esophageal dilatation 06/30/2023   Abnormal uterine bleeding 03/15/2018    Past Medical History:  Diagnosis Date   GERD (gastroesophageal reflux disease)    HSV infection    Seasonal allergies     Family History  Problem Relation Age of Onset   Breast cancer Mother    AAA (abdominal aortic aneurysm) Father    Past Surgical History:  Procedure Laterality Date   BREAST BIOPSY Left    benign   BREAST EXCISIONAL BIOPSY     COLONOSCOPY     TOTAL LAPAROSCOPIC HYSTERECTOMY WITH SALPINGECTOMY Bilateral 03/15/2018   Procedure: TOTAL LAPAROSCOPIC HYSTERECTOMY WITH SALPINGECTOMY;  Surgeon:  Myna Hidalgo, DO;  Location: [facility];  Service: Gynecology;  Laterality: Bilateral;   TUBAL LIGATION     UPPER GI ENDOSCOPY     WISDOM TOOTH EXTRACTION     Social History   Social History Narrative   Not on file    There is no immunization history on file for this patient.   Objective: Vital Signs: LMP  (LMP Unknown) Comment: Cont.bleeding   Physical Exam Vitals and nursing note reviewed.  Constitutional:      Appearance: She is well-developed.  HENT:     Head: Normocephalic and atraumatic.  Eyes:     Conjunctiva/sclera: Conjunctivae normal.  Cardiovascular:     Rate and Rhythm: Normal rate and regular rhythm.     Heart sounds: Normal heart sounds.  Pulmonary:     Effort: Pulmonary effort is normal.     Breath sounds: Normal breath sounds.  Abdominal:     General: Bowel sounds are normal.     Palpations: Abdomen is soft.  Musculoskeletal:     Cervical back: Normal range of motion.  Lymphadenopathy:     Cervical: No cervical adenopathy.  Skin:    General: Skin is warm and dry.     Capillary Refill: Capillary refill takes less than 2 seconds.  Neurological:     Mental Status: She is alert and oriented to person, place, and time.  Psychiatric:        Behavior: Behavior normal.      Musculoskeletal Exam: ***  CDAI Exam: CDAI Score: -- Patient Global: --; Provider Global: -- Swollen: --; Tender: -- Joint Exam 01/02/2024   No joint exam has  been documented for this visit   There is currently no information documented on the homunculus. Go to the Rheumatology activity and complete the homunculus joint exam.  Investigation: No additional findings.  Imaging: No results found.  Recent Labs: Lab Results  Component Value Date   WBC 5.6 11/03/2022   HGB 13.4 11/03/2022   PLT 286 11/03/2022   NA 140 11/03/2022   K 4.0 11/03/2022   CL 102 11/03/2022   CO2 29 11/03/2022   GLUCOSE 92 11/03/2022   BUN 18 11/03/2022   CREATININE 0.71 11/03/2022   BILITOT  0.2 11/03/2022   ALKPHOS 51 03/13/2018   AST 25 11/03/2022   ALT 20 11/03/2022   PROT 7.4 11/03/2022   ALBUMIN 3.7 03/13/2018   CALCIUM 9.6 11/03/2022   GFRAA >60 03/16/2018    Speciality Comments: No specialty comments available.  Procedures:  No procedures performed Allergies: Patient has no known allergies.   Assessment / Plan:     Visit Diagnoses: Myofascial pain  Primary osteoarthritis of both hands  Chronic left shoulder pain  Positive ANA (antinuclear antibody)  Elevated sed rate  Elevated C-reactive protein (CRP)  Chondromalacia of both patellae  Primary osteoarthritis of both feet  Trapezius muscle spasm  Cervicalgia  Hypercholesterolemia  History of diverticulosis  Rosacea  Prediabetes  History of gastroesophageal reflux (GERD)  HSV-2 infection  History of HPV infection  History of esophageal dilatation  Orders: No orders of the defined types were placed in this encounter.  No orders of the defined types were placed in this encounter.   Face-to-face time spent with patient was *** minutes. Greater than 50% of time was spent in counseling and coordination of care.  Follow-Up Instructions: No follow-ups on file.   Gearldine Bienenstock, PA-C  Note - This record has been created using Dragon software.  Chart creation errors have been sought, but may not always  have been located. Such creation errors do not reflect on  the standard of medical care.

## 2023-12-23 ENCOUNTER — Other Ambulatory Visit: Payer: Self-pay | Admitting: Family Medicine

## 2023-12-23 DIAGNOSIS — Z1231 Encounter for screening mammogram for malignant neoplasm of breast: Secondary | ICD-10-CM

## 2024-01-02 ENCOUNTER — Ambulatory Visit: Payer: BC Managed Care – PPO | Admitting: Physician Assistant

## 2024-01-02 DIAGNOSIS — Z8719 Personal history of other diseases of the digestive system: Secondary | ICD-10-CM

## 2024-01-02 DIAGNOSIS — Z9889 Other specified postprocedural states: Secondary | ICD-10-CM

## 2024-01-02 DIAGNOSIS — M2242 Chondromalacia patellae, left knee: Secondary | ICD-10-CM

## 2024-01-02 DIAGNOSIS — E7800 Pure hypercholesterolemia, unspecified: Secondary | ICD-10-CM

## 2024-01-02 DIAGNOSIS — B009 Herpesviral infection, unspecified: Secondary | ICD-10-CM

## 2024-01-02 DIAGNOSIS — M7918 Myalgia, other site: Secondary | ICD-10-CM

## 2024-01-02 DIAGNOSIS — R768 Other specified abnormal immunological findings in serum: Secondary | ICD-10-CM

## 2024-01-02 DIAGNOSIS — M19072 Primary osteoarthritis, left ankle and foot: Secondary | ICD-10-CM

## 2024-01-02 DIAGNOSIS — R7982 Elevated C-reactive protein (CRP): Secondary | ICD-10-CM

## 2024-01-02 DIAGNOSIS — M62838 Other muscle spasm: Secondary | ICD-10-CM

## 2024-01-02 DIAGNOSIS — L719 Rosacea, unspecified: Secondary | ICD-10-CM

## 2024-01-02 DIAGNOSIS — R700 Elevated erythrocyte sedimentation rate: Secondary | ICD-10-CM

## 2024-01-02 DIAGNOSIS — G8929 Other chronic pain: Secondary | ICD-10-CM

## 2024-01-02 DIAGNOSIS — R7303 Prediabetes: Secondary | ICD-10-CM

## 2024-01-02 DIAGNOSIS — Z8619 Personal history of other infectious and parasitic diseases: Secondary | ICD-10-CM

## 2024-01-02 DIAGNOSIS — M19041 Primary osteoarthritis, right hand: Secondary | ICD-10-CM

## 2024-01-02 DIAGNOSIS — M542 Cervicalgia: Secondary | ICD-10-CM

## 2024-01-23 ENCOUNTER — Ambulatory Visit
Admission: RE | Admit: 2024-01-23 | Discharge: 2024-01-23 | Disposition: A | Discharge status: Home / Self Care | Payer: 59 | Source: Ambulatory Visit | Attending: Family Medicine | Admitting: Family Medicine

## 2024-01-23 DIAGNOSIS — Z1231 Encounter for screening mammogram for malignant neoplasm of breast: Secondary | ICD-10-CM

## 2024-01-27 NOTE — Progress Notes (Signed)
 Office Visit Note  Patient: Stacey Flores             Date of Birth: Dec 17, 1970           MRN: [id number]             PCP: Irven Coe, MD Referring: Irven Coe, MD Visit Date: 02/10/2024 Occupation: @GUAROCC @  Subjective:  Pain in both knees  History of Present Illness: Stacey Flores is a 53 y.o. female with history of myofascial pain and osteoarthritis.  She is taking Cymbalta 30 mg 1 capsule by mouth daily and methocarbamol 500 mg 1 tablet daily as needed for muscle spasms.   Patient states that overall her myofascial pain has been more manageable.  Patient reports that she is no longer going to integrative therapies but noticed benefit while going for 10 months.  She is now establishing with a massage therapist once a month in [place] and will also be working with an experienced Control and instrumentation engineer.  She continues to have chronic pain in both knee joints particularly after sitting for prolonged periods of time.  Her trapezius muscle tension and tenderness has been manageable. She will be following up with her PCP in April 2025 and will be having updated lab work.    Activities of Daily Living:  Patient reports morning stiffness for 1 hour.   Patient Reports nocturnal pain.  Difficulty dressing/grooming: Denies Difficulty climbing stairs: Reports Difficulty getting out of chair: Reports Difficulty using hands for taps, buttons, cutlery, and/or writing: Reports  Review of Systems  Constitutional:  Positive for fatigue.  HENT:  Negative for mouth sores and mouth dryness.   Eyes:  Negative for dryness.  Respiratory:  Negative for shortness of breath.   Cardiovascular:  Negative for chest pain and palpitations.  Gastrointestinal:  Negative for blood in stool, constipation and diarrhea.  Endocrine: Negative for increased urination.  Genitourinary:  Negative for involuntary urination.  Musculoskeletal:  Positive for joint pain, gait problem, joint pain, myalgias, muscle weakness,  morning stiffness, muscle tenderness and myalgias. Negative for joint swelling.  Skin:  Negative for color change, rash, hair loss and sensitivity to sunlight.  Allergic/Immunologic: Negative for susceptible to infections.  Neurological:  Positive for headaches. Negative for dizziness.  Hematological:  Negative for swollen glands.  Psychiatric/Behavioral:  Negative for depressed mood and sleep disturbance. The patient is not nervous/anxious.     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   History of esophageal dilatation 06/30/2023   Abnormal uterine bleeding 03/15/2018    Past Medical History:  Diagnosis Date   GERD (gastroesophageal reflux disease)    HSV infection    Seasonal allergies     Family History  Problem Relation Age of Onset   Breast cancer Mother    AAA (abdominal aortic aneurysm) Father    Past Surgical History:  Procedure Laterality Date   BREAST BIOPSY Left    benign   BREAST EXCISIONAL BIOPSY     COLONOSCOPY     TOTAL LAPAROSCOPIC HYSTERECTOMY WITH SALPINGECTOMY Bilateral 03/15/2018   Procedure: TOTAL LAPAROSCOPIC HYSTERECTOMY WITH SALPINGECTOMY;  Surgeon: Myna Hidalgo, DO;  Location: [facility];  Service: Gynecology;  Laterality:  Bilateral;   TUBAL LIGATION     UPPER GI ENDOSCOPY     WISDOM TOOTH EXTRACTION     Social History   Social History Narrative   Not on file    There is no immunization history on file for this patient.   Objective: Vital Signs: BP 119/88 (BP Location: Left Arm, Patient Position: Sitting, Cuff Size: Large)   Pulse 86   Resp 16   Ht 5\' 5"  (1.651 m)   Wt 267 lb 9.6 oz (121.4 kg)    LMP  (LMP Unknown) Comment: Cont.bleeding  BMI 44.53 kg/m    Physical Exam Vitals and nursing note reviewed.  Constitutional:      Appearance: She is well-developed.  HENT:     Head: Normocephalic and atraumatic.  Eyes:     Conjunctiva/sclera: Conjunctivae normal.  Cardiovascular:     Rate and Rhythm: Normal rate and regular rhythm.     Heart sounds: Normal heart sounds.  Pulmonary:     Effort: Pulmonary effort is normal.     Breath sounds: Normal breath sounds.  Abdominal:     General: Bowel sounds are normal.     Palpations: Abdomen is soft.  Musculoskeletal:     Cervical back: Normal range of motion.  Lymphadenopathy:     Cervical: No cervical adenopathy.  Skin:    General: Skin is warm and dry.     Capillary Refill: Capillary refill takes less than 2 seconds.  Neurological:     Mental Status: She is alert and oriented to person, place, and time.  Psychiatric:        Behavior: Behavior normal.      Musculoskeletal Exam: C-spine has limited range of motion without rotation.  Left trapezius muscle tension.  Shoulder joints, elbow joints, wrist joints, MCPs, PIPs, DIPs have good range of motion with no synovitis.  Complete fist formation bilaterally.  Hip joints have good range of motion with no groin pain.  Discomfort range of motion of both knees but no warmth or effusion noted.  Ankle joints have good range of motion with no tenderness or joint swelling.  CDAI Exam: CDAI Score: -- Patient Global: --; Provider Global: -- Swollen: --; Tender: -- Joint Exam 02/10/2024   No joint exam has been documented for this visit   There is currently no information documented on the homunculus. Go to the Rheumatology activity and complete the homunculus joint exam.  Investigation: No additional findings.  Imaging: MM 3D SCREENING MAMMOGRAM BILATERAL BREAST Result Date: 01/24/2024 CLINICAL DATA:  Screening. EXAM: DIGITAL SCREENING BILATERAL MAMMOGRAM WITH TOMOSYNTHESIS AND CAD  TECHNIQUE: Bilateral screening digital craniocaudal and mediolateral oblique mammograms were obtained. Bilateral screening digital breast tomosynthesis was performed. The images were evaluated with computer-aided detection. COMPARISON:  Previous exam(s). ACR Breast Density Category a: The breasts are almost entirely fatty. FINDINGS: There are no findings suspicious for malignancy. IMPRESSION: No mammographic evidence of malignancy. A result letter of this screening mammogram will be mailed directly to the patient. RECOMMENDATION: Screening mammogram in one year. (Code:SM-B-01Y) BI-RADS CATEGORY  1: Negative. Electronically Signed   By: Hulan Saas M.D.   On: 01/24/2024 15:32    Recent Labs: Lab Results  Component Value Date   WBC 5.6 11/03/2022   HGB 13.4 11/03/2022   PLT 286 11/03/2022   NA 140 11/03/2022   K 4.0 11/03/2022   CL 102 11/03/2022   CO2 29 11/03/2022   GLUCOSE 92 11/03/2022   BUN 18 11/03/2022   CREATININE 0.71 11/03/2022  BILITOT 0.2 11/03/2022   ALKPHOS 51 03/13/2018   AST 25 11/03/2022   ALT 20 11/03/2022   PROT 7.4 11/03/2022   ALBUMIN 3.7 03/13/2018   CALCIUM 9.6 11/03/2022   GFRAA >60 03/16/2018    Speciality Comments: No specialty comments available.  Procedures:  No procedures performed Allergies: Patient has no known allergies.   Assessment / Plan:     Visit Diagnoses: Myofascial pain - Patient remains on methocarbamol 500 mg 1 tablet daily as needed and Cymbalta 30 mg 1 capsule by mouth daily.  Patient was previously taking Cymbalta 60 mg daily but had excessive daytime drowsiness and has reduced the dose to 30 mg daily.  Overall her symptoms have been manageable since completing 10 months of therapy at integrative therapies.  She would no longer be following up at integrative therapies due to changes within their office but has established care with a massage therapist Stacey Flores who she will be seeing on a monthly basis.  Patient plans on focusing on  general health and wellness and may work with a Control and instrumentation engineer.  She is going to try to focus on weight loss as well as increasing her activity level as tolerated.  Strongly encouraged the patient to consider water aerobics or water therapy.  She also plans on trying to focus on lower extremity strengthening due to ongoing pain and stiffness in both knee joints.  No warmth or effusion was noted on examination today.  She will notify us if she develops any new or worsening symptoms.  She will follow-up in the office in 6 months or sooner if needed.  Primary osteoarthritis of both hands - X-rays of both hands on 11/03/22 were consistent with early osteoarthritis.  Ultrasound of both hands was negative for synovitis on 12/16/2022.  No synovitis noted on examination today.  Chronic left shoulder pain - Left subacromial bursa injection March 28, 2023.  Good ROM.    Positive ANA (antinuclear antibody) - ANA is low titer and not significant. 09/08/22: ANA+, RF-, ESR 33, CRP 9.11/03/2022: ANA 1:40NH, ENA-, history of fatigue, arthralgias, and hair thinning.  No new or worsening symptoms concerning for systemic lupus at this time.  Elevated sed rate - ESR 38 on 09/16/23. CRP WNL 09/16/23. No synovitis noted.   Elevated C-reactive protein (CRP): CRP within normal limits on 09/16/2023.  She will be having updated lab work with her PCP in April 2025.  Chondromalacia of both patellae: Mild: Patient presents today with ongoing discomfort in both knees.  She has good range of motion of both knees with no warmth or effusion on examination today.  Patient declined a cortisone injection at this time.  Discussed the importance of lower extremity muscle strengthening.  Patient plans on working with a health coach for weight loss and overall wellness.  Encouraged the patient to try water aerobics or water therapy.  Primary osteoarthritis of both feet - X-rays on 11/03/22 of both feet were consistent with early  osteoarthritis.  Trapezius muscle spasm: Manageable.  Patient had a trapezius trigger point injection performed on 01/25/2023.  No longer going to integrative therapies.  She will be meeting with a massage therapist on a monthly basis in [place].  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 No symptoms of radiculopathy at this time.  Patient will be meeting with a massage therapist in [place] once a month.  Other medical conditions are listed  as follows:  Hypercholesterolemia  History of diverticulosis  Rosacea  Prediabetes  History of gastroesophageal reflux (GERD)  HSV-2 infection  History of HPV infection  History of esophageal dilatation  Orders: No orders of the defined types were placed in this encounter.  No orders of the defined types were placed in this encounter.    Follow-Up Instructions: Return in about 6 months (around 08/12/2024) for Myofascial pain, Osteoarthritis.   Gearldine Bienenstock, PA-C  Note - This record has been created using Dragon software.  Chart creation errors have been sought, but may not always  have been located. Such creation errors do not reflect on  the standard of medical care.

## 2024-02-10 ENCOUNTER — Ambulatory Visit: Payer: Self-pay | Attending: Physician Assistant | Admitting: Physician Assistant

## 2024-02-10 ENCOUNTER — Encounter: Payer: Self-pay | Admitting: Physician Assistant

## 2024-02-10 VITALS — BP 119/88 | HR 86 | Resp 16 | Ht 65.0 in | Wt 267.6 lb

## 2024-02-10 DIAGNOSIS — M19072 Primary osteoarthritis, left ankle and foot: Secondary | ICD-10-CM

## 2024-02-10 DIAGNOSIS — R7303 Prediabetes: Secondary | ICD-10-CM

## 2024-02-10 DIAGNOSIS — R768 Other specified abnormal immunological findings in serum: Secondary | ICD-10-CM | POA: Diagnosis not present

## 2024-02-10 DIAGNOSIS — M7918 Myalgia, other site: Secondary | ICD-10-CM

## 2024-02-10 DIAGNOSIS — L719 Rosacea, unspecified: Secondary | ICD-10-CM

## 2024-02-10 DIAGNOSIS — Z9889 Other specified postprocedural states: Secondary | ICD-10-CM

## 2024-02-10 DIAGNOSIS — M19071 Primary osteoarthritis, right ankle and foot: Secondary | ICD-10-CM

## 2024-02-10 DIAGNOSIS — M19041 Primary osteoarthritis, right hand: Secondary | ICD-10-CM | POA: Diagnosis not present

## 2024-02-10 DIAGNOSIS — R7982 Elevated C-reactive protein (CRP): Secondary | ICD-10-CM

## 2024-02-10 DIAGNOSIS — Z8619 Personal history of other infectious and parasitic diseases: Secondary | ICD-10-CM

## 2024-02-10 DIAGNOSIS — M2241 Chondromalacia patellae, right knee: Secondary | ICD-10-CM

## 2024-02-10 DIAGNOSIS — R700 Elevated erythrocyte sedimentation rate: Secondary | ICD-10-CM

## 2024-02-10 DIAGNOSIS — M62838 Other muscle spasm: Secondary | ICD-10-CM

## 2024-02-10 DIAGNOSIS — M2242 Chondromalacia patellae, left knee: Secondary | ICD-10-CM

## 2024-02-10 DIAGNOSIS — M542 Cervicalgia: Secondary | ICD-10-CM

## 2024-02-10 DIAGNOSIS — M25512 Pain in left shoulder: Secondary | ICD-10-CM

## 2024-02-10 DIAGNOSIS — M19042 Primary osteoarthritis, left hand: Secondary | ICD-10-CM

## 2024-02-10 DIAGNOSIS — E7800 Pure hypercholesterolemia, unspecified: Secondary | ICD-10-CM

## 2024-02-10 DIAGNOSIS — G8929 Other chronic pain: Secondary | ICD-10-CM

## 2024-02-10 DIAGNOSIS — Z8719 Personal history of other diseases of the digestive system: Secondary | ICD-10-CM

## 2024-02-10 DIAGNOSIS — B009 Herpesviral infection, unspecified: Secondary | ICD-10-CM

## 2024-02-10 NOTE — Patient Instructions (Signed)
 Knee Exercises Ask your health care provider which exercises are safe for you. Do exercises exactly as told by your health care provider and adjust them as directed. It is normal to feel mild stretching, pulling, tightness, or discomfort as you do these exercises. Stop right away if you feel sudden pain or your pain gets worse. Do not begin these exercises until told by your health care provider. Stretching and range-of-motion exercises These exercises warm up your muscles and joints and improve the movement and flexibility of your knee. These exercises also help to relieve pain and swelling. Knee extension, prone  Lie on your abdomen (prone position) on a bed. Place your left / right knee just beyond the edge of the surface so your knee is not on the bed. You can put a towel under your left / right thigh just above your kneecap for comfort. Relax your leg muscles and allow gravity to straighten your knee (extension). You should feel a stretch behind your left / right knee. Hold this position for __________ seconds. Scoot up so your knee is supported between repetitions. Repeat __________ times. Complete this exercise __________ times a day. Knee flexion, active  Lie on your back with both legs straight. If this causes back discomfort, bend your left / right knee so your foot is flat on the floor. Slowly slide your left / right heel back toward your buttocks. Stop when you feel a gentle stretch in the front of your knee or thigh (flexion). Hold this position for __________ seconds. Slowly slide your left / right heel back to the starting position. Repeat __________ times. Complete this exercise __________ times a day. Quadriceps stretch, prone  Lie on your abdomen on a firm surface, such as a bed or padded floor. Bend your left / right knee and hold your ankle. If you cannot reach your ankle or pant leg, loop a belt around your foot and grab the belt instead. Gently pull your heel toward your  buttocks. Your knee should not slide out to the side. You should feel a stretch in the front of your thigh and knee (quadriceps). Hold this position for __________ seconds. Repeat __________ times. Complete this exercise __________ times a day. Hamstring, supine  Lie on your back (supine position). Loop a belt or towel over the ball of your left / right foot. The ball of your foot is on the walking surface, right under your toes. Straighten your left / right knee and slowly pull on the belt to raise your leg until you feel a gentle stretch behind your knee (hamstring). Do not let your knee bend while you do this. Keep your other leg flat on the floor. Hold this position for __________ seconds. Repeat __________ times. Complete this exercise __________ times a day. Strengthening exercises These exercises build strength and endurance in your knee. Endurance is the ability to use your muscles for a long time, even after they get tired. Quadriceps, isometric This exercise strengthens the muscles in front of your thigh (quadriceps) without moving your knee joint (isometric). Lie on your back with your left / right leg extended and your other knee bent. Put a rolled towel or small pillow under your knee if told by your health care provider. Slowly tense the muscles in the front of your left / right thigh. You should see your kneecap slide up toward your hip or see increased dimpling just above the knee. This motion will push the back of the knee toward the floor.  For __________ seconds, hold the muscle as tight as you can without increasing your pain. Relax the muscles slowly and completely. Repeat __________ times. Complete this exercise __________ times a day. Straight leg raises This exercise strengthens the muscles in front of your thigh (quadriceps) and the muscles that move your hips (hip flexors). Lie on your back with your left / right leg extended and your other knee bent. Tense the  muscles in the front of your left / right thigh. You should see your kneecap slide up or see increased dimpling just above the knee. Your thigh may even shake a bit. Keep these muscles tight as you raise your leg 4-6 inches (10-15 cm) off the floor. Do not let your knee bend. Hold this position for __________ seconds. Keep these muscles tense as you lower your leg. Relax your muscles slowly and completely after each repetition. Repeat __________ times. Complete this exercise __________ times a day. Hamstring, isometric  Lie on your back on a firm surface. Bend your left / right knee about __________ degrees. Dig your left / right heel into the surface as if you are trying to pull it toward your buttocks. Tighten the muscles in the back of your thighs (hamstring) to "dig" as hard as you can without increasing any pain. Hold this position for __________ seconds. Release the tension gradually and allow your muscles to relax completely for __________ seconds after each repetition. Repeat __________ times. Complete this exercise __________ times a day. Hamstring curls If told by your health care provider, do this exercise while wearing ankle weights. Begin with __________lb / kg weights. Then increase the weight by 1 lb (0.5 kg) increments. Do not wear ankle weights that are more than __________lb / kg. Lie on your abdomen with your legs straight. Bend your left / right knee as far as you can without feeling pain. Keep your hips flat against the floor. Hold this position for __________ seconds. Slowly lower your leg to the starting position. Repeat __________ times. Complete this exercise __________ times a day. Squats This exercise strengthens the muscles in front of your thigh and knee (quadriceps). Stand in front of a table, with your feet and knees pointing straight ahead. You may rest your hands on the table for balance but not for support. Slowly bend your knees and lower your hips like you  are going to sit in a chair. Keep your weight over your heels, not over your toes. Keep your lower legs upright so they are parallel with the table legs. Do not let your hips go lower than your knees. Do not bend lower than told by your health care provider. If your knee pain increases, do not bend as low. Hold the squat position for __________ seconds. Slowly push with your legs to return to standing. Do not use your hands to pull yourself to standing. Repeat __________ times. Complete this exercise __________ times a day. Wall slides This exercise strengthens the muscles in front of your thigh and knee (quadriceps). Lean your back against a smooth wall or door, and walk your feet out 18-24 inches (46-61 cm) from it. Place your feet hip-width apart. Slowly slide down the wall or door until your knees bend __________ degrees. Keep your knees over your heels, not over your toes. Keep your knees in line with your hips. Hold this position for __________ seconds. Repeat __________ times. Complete this exercise __________ times a day. Straight leg raises, side-lying This exercise strengthens the muscles that rotate  the leg at the hip and move it away from your body (hip abductors). Lie on your side with your left / right leg in the top position. Lie so your head, shoulder, knee, and hip line up. You may bend your bottom knee to help you keep your balance. Roll your hips slightly forward so your hips are stacked directly over each other and your left / right knee is facing forward. Leading with your heel, lift your top leg 4-6 inches (10-15 cm). You should feel the muscles in your outer hip lifting. Do not let your foot drift forward. Do not let your knee roll toward the ceiling. Hold this position for __________ seconds. Slowly return your leg to the starting position. Let your muscles relax completely after each repetition. Repeat __________ times. Complete this exercise __________ times a  day. Straight leg raises, prone This exercise stretches the muscles that move your hips away from the front of the pelvis (hip extensors). Lie on your abdomen on a firm surface. You can put a pillow under your hips if that is more comfortable. Tense the muscles in your buttocks and lift your left / right leg about 4-6 inches (10-15 cm). Keep your knee straight as you lift your leg. Hold this position for __________ seconds. Slowly lower your leg to the starting position. Let your leg relax completely after each repetition. Repeat __________ times. Complete this exercise __________ times a day. This information is not intended to replace advice given to you by your health care provider. Make sure you discuss any questions you have with your health care provider. Document Revised: 07/28/2021 Document Reviewed: 07/28/2021 Elsevier Patient Education  2024 [employer].

## 2024-04-04 LAB — LAB REPORT - SCANNED
A1c: 6
EGFR: 105

## 2024-04-09 ENCOUNTER — Telehealth: Payer: Self-pay | Admitting: *Deleted

## 2024-04-09 NOTE — Telephone Encounter (Signed)
 Labs received from:[facility] on: 04/04/2024   Reviewed by:Dr. Nicholas Bari   Labs drawn: CRP, Hgb A1C, CBC with diff, CMP, Sed Rate, Lipid Panel  Results: Hgb A1C 6.0   ESR 32   Cholesterol 253   CHOL/HDL 4.4   Triglyceride 201   NHDL 196   LDL Chol Calc 159

## 2024-08-10 NOTE — Progress Notes (Signed)
 Office Visit Note  Patient: Stacey Flores             Date of Birth: 08-Mar-1971           MRN: [id number]             PCP: Leonel Cole, MD Referring: Leonel Cole, MD Visit Date: 08/24/2024 Occupation: Data Unavailable  Subjective:  Pain in joints  History of Present Illness: Stacey Flores is a 53 y.o. female with myofascial pain and osteoarthritis.  She returns today after her last visit in March 2025.  She states that she takes methocarbamol  as needed for muscle spasms.  She has been taking Cymbalta  30 mg daily.  She continues to have some left-sided trapezius spasm.  She has been going for massage therapy every 3 weeks.  She gets cupping which is helpful.  She continues to have some discomfort in her knee joints in her feet but is manageable.  She relates some discomfort from increased activities.  She tripped and fell August 2025 and had some scraping on her left knee which is healing.  He recently was diagnosed with skin abscess on her right abdominal region and is on doxycycline for that.    Activities of Daily Living:  Patient reports morning stiffness for 1-2 hours.   Patient Reports nocturnal pain.  Difficulty dressing/grooming: Denies Difficulty climbing stairs: Denies Difficulty getting out of chair: Denies Difficulty using hands for taps, buttons, cutlery, and/or writing: Reports  Review of Systems  Constitutional:  Positive for fatigue.  HENT:  Negative for mouth sores and mouth dryness.   Eyes:  Negative for dryness.  Respiratory:  Negative for shortness of breath.   Cardiovascular:  Negative for chest pain and palpitations.  Gastrointestinal:  Negative for blood in stool, constipation and diarrhea.  Endocrine: Negative for increased urination.  Genitourinary:  Negative for involuntary urination.  Musculoskeletal:  Positive for joint pain, joint pain, joint swelling, myalgias, muscle weakness, morning stiffness, muscle tenderness and myalgias. Negative for gait  problem.  Skin:  Positive for ulcers. Negative for color change, rash, hair loss and sensitivity to sunlight.  Allergic/Immunologic: Negative for susceptible to infections.  Neurological:  Positive for tremors. Negative for dizziness, numbness and headaches.  Hematological:  Negative for swollen glands.  Psychiatric/Behavioral:  Negative for depressed mood and sleep disturbance. The patient is not nervous/anxious.     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   History of esophageal dilatation 06/30/2023   Abnormal uterine bleeding 03/15/2018    Past Medical History:  Diagnosis Date   GERD (gastroesophageal reflux disease)    HSV infection    Seasonal allergies     Family History  Problem Relation Age of Onset   Breast cancer Mother    AAA (abdominal aortic aneurysm) Father    Past Surgical History:  Procedure Laterality Date   BREAST BIOPSY Left    benign   BREAST EXCISIONAL BIOPSY     COLONOSCOPY     TOTAL LAPAROSCOPIC HYSTERECTOMY WITH SALPINGECTOMY Bilateral 03/15/2018   Procedure: TOTAL LAPAROSCOPIC HYSTERECTOMY WITH SALPINGECTOMY;  Surgeon: Ozan, Jennifer, DO;  Location: [facility];  Service: Gynecology;  Laterality: Bilateral;  TUBAL LIGATION     UPPER GI ENDOSCOPY     WISDOM TOOTH EXTRACTION     Social History   Tobacco Use   Smoking status: Never    Passive exposure: Past   Smokeless tobacco: Never  Vaping Use   Vaping status: Never Used  Substance Use Topics   Alcohol use: Not Currently    Comment: 1 or 2 yearly   Drug use: Never   Social History   Social History Narrative   Not on file      There is no  immunization history on file for this patient.   Objective: Vital Signs: BP 132/85   Pulse 67   Temp 97.7 F (36.5 C)   Resp 15   Ht 5' 4 (1.626 m)   Wt 263 lb 3.2 oz (119.4 kg)   LMP  (LMP Unknown) Comment: Cont.bleeding  BMI 45.18 kg/m    Physical Exam Vitals and nursing note reviewed.  Constitutional:      Appearance: She is well-developed.  HENT:     Head: Normocephalic and atraumatic.  Eyes:     Conjunctiva/sclera: Conjunctivae normal.  Cardiovascular:     Rate and Rhythm: Normal rate and regular rhythm.     Heart sounds: Normal heart sounds.  Pulmonary:     Effort: Pulmonary effort is normal.     Breath sounds: Normal breath sounds.  Abdominal:     General: Bowel sounds are normal.     Palpations: Abdomen is soft.  Musculoskeletal:     Cervical back: Normal range of motion.  Lymphadenopathy:     Cervical: No cervical adenopathy.  Skin:    General: Skin is warm and dry.     Capillary Refill: Capillary refill takes less than 2 seconds.  Neurological:     Mental Status: She is alert and oriented to person, place, and time.  Psychiatric:        Behavior: Behavior normal.      Musculoskeletal Exam: Cervical, thoracic and lumbar spine were in good range of motion.  She had right trapezius tenderness.  There was no SI joint tenderness.  Shoulder joints, elbow joints, wrist joints, MCPs, PIPs and DIPs were in good range of motion with no synovitis.  Hip joints and knee joints were in good range of motion without any warmth swelling or effusion.  There was no tenderness over ankles or MTPs.  She had few tender points.  CDAI Exam: CDAI Score: -- Patient Global: --; Provider Global: -- Swollen: --; Tender: -- Joint Exam 08/24/2024   No joint exam has been documented for this visit   There is currently no information documented on the homunculus. Go to the Rheumatology activity and complete the homunculus joint exam.  Investigation: No additional  findings.  Imaging: No results found.  Recent Labs: Lab Results  Component Value Date   WBC 5.6 11/03/2022   HGB 13.4 11/03/2022   PLT 286 11/03/2022   NA 140 11/03/2022   K 4.0 11/03/2022   CL 102 11/03/2022   CO2 29 11/03/2022   GLUCOSE 92 11/03/2022   BUN 18 11/03/2022   CREATININE 0.71 11/03/2022   BILITOT 0.2 11/03/2022   ALKPHOS 51 03/13/2018   AST 25 11/03/2022   ALT 20 11/03/2022   PROT 7.4 11/03/2022   ALBUMIN 3.7 03/13/2018   CALCIUM 9.6 11/03/2022   GFRAA >60 03/16/2018    Speciality Comments: No specialty comments available.  Procedures:  No procedures performed Allergies: Patient has no known allergies.  Assessment / Plan:     Visit Diagnoses: Myofascial pain -she continues to have generalized pain and discomfort.  She states the pain gets worse after increased activity.  She has increased workload currently.  She had tenderness over the right trapezius region and also over the epicondyle and medial aspect of her knee.  Patient remains on methocarbamol  500 mg 1 tablet daily as needed and Cymbalta  30 mg 1 capsule by mouth daily.  She has been tolerating both medications well.  Primary osteoarthritis of both hands -she complains of a stiffness in her hands.  No synovitis was noted.  X-rays of both hands on 11/03/22 were consistent with early osteoarthritis.  Ultrasound of both hands was negative for synovitis on 12/16/2022.  Chronic left shoulder pain -she has intermittent discomfort with activities.  Left subacromial bursa injection March 28, 2023.  Positive ANA (antinuclear antibody)-she had low titer ANA, ENA panel negative and no clinical features of autoimmune disease.  Elevated sed rate - ESR 38 on 09/16/23. CRP WNL 09/16/23.  Followed by her PCP.  Elevated C-reactive protein (CRP) - CRP within normal limits on 09/16/2023.  Chondromalacia of both patellae - Mild: She has intermittent discomfort in her knee joints.  No warmth swelling or effusion was  noted.  She recently slipped and scraped her left knee which is healing well.  Primary osteoarthritis of both feet -she reports stiffness in her feet.  No tenderness over ankles or MTPs was noted.  X-rays on 11/03/22 of both feet were consistent with early osteoarthritis.  Trapezius muscle spasm -she states the trapezius discomfort persist and improves after massage therapy.  She has been going for massage therapy every 3 weeks in [place].  She also gets cupping.  Trapezius trigger point injection performed on 01/25/2023.   Cervicalgia -she has stiffness in her cervical spine.  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  Other medical problems are listed as follows:  History of diverticulosis  Hypercholesterolemia  Prediabetes  Rosacea  History of gastroesophageal reflux (GERD)  History of HPV infection  HSV-2 infection  History of esophageal dilatation  Orders: No orders of the defined types were placed in this encounter.  No orders of the defined types were placed in this encounter.    Follow-Up Instructions: Return in about 6 months (around 02/21/2025) for Osteoarthritis.   Maya Nash, MD  Note - This record has been created using [employer]ist.  Chart creation errors have been sought, but may not always  have been located. Such creation errors do not reflect on  the standard of medical care.

## 2024-08-24 ENCOUNTER — Encounter: Payer: Self-pay | Admitting: Rheumatology

## 2024-08-24 ENCOUNTER — Ambulatory Visit: Attending: Rheumatology | Admitting: Rheumatology

## 2024-08-24 VITALS — BP 132/85 | HR 67 | Temp 97.7°F | Resp 15 | Ht 64.0 in | Wt 263.2 lb

## 2024-08-24 DIAGNOSIS — R768 Other specified abnormal immunological findings in serum: Secondary | ICD-10-CM | POA: Diagnosis not present

## 2024-08-24 DIAGNOSIS — Z9889 Other specified postprocedural states: Secondary | ICD-10-CM

## 2024-08-24 DIAGNOSIS — G8929 Other chronic pain: Secondary | ICD-10-CM

## 2024-08-24 DIAGNOSIS — M542 Cervicalgia: Secondary | ICD-10-CM

## 2024-08-24 DIAGNOSIS — L719 Rosacea, unspecified: Secondary | ICD-10-CM

## 2024-08-24 DIAGNOSIS — M19041 Primary osteoarthritis, right hand: Secondary | ICD-10-CM

## 2024-08-24 DIAGNOSIS — E7800 Pure hypercholesterolemia, unspecified: Secondary | ICD-10-CM

## 2024-08-24 DIAGNOSIS — M7918 Myalgia, other site: Secondary | ICD-10-CM | POA: Diagnosis not present

## 2024-08-24 DIAGNOSIS — M25512 Pain in left shoulder: Secondary | ICD-10-CM

## 2024-08-24 DIAGNOSIS — M19071 Primary osteoarthritis, right ankle and foot: Secondary | ICD-10-CM

## 2024-08-24 DIAGNOSIS — Z8619 Personal history of other infectious and parasitic diseases: Secondary | ICD-10-CM

## 2024-08-24 DIAGNOSIS — M19042 Primary osteoarthritis, left hand: Secondary | ICD-10-CM

## 2024-08-24 DIAGNOSIS — M62838 Other muscle spasm: Secondary | ICD-10-CM

## 2024-08-24 DIAGNOSIS — M2241 Chondromalacia patellae, right knee: Secondary | ICD-10-CM

## 2024-08-24 DIAGNOSIS — M19072 Primary osteoarthritis, left ankle and foot: Secondary | ICD-10-CM

## 2024-08-24 DIAGNOSIS — M2242 Chondromalacia patellae, left knee: Secondary | ICD-10-CM

## 2024-08-24 DIAGNOSIS — B009 Herpesviral infection, unspecified: Secondary | ICD-10-CM

## 2024-08-24 DIAGNOSIS — Z8719 Personal history of other diseases of the digestive system: Secondary | ICD-10-CM

## 2024-08-24 DIAGNOSIS — R700 Elevated erythrocyte sedimentation rate: Secondary | ICD-10-CM

## 2024-08-24 DIAGNOSIS — R7303 Prediabetes: Secondary | ICD-10-CM

## 2024-08-24 DIAGNOSIS — R7982 Elevated C-reactive protein (CRP): Secondary | ICD-10-CM

## 2024-09-20 LAB — LAB REPORT - SCANNED
A1c: 5.8
EGFR: 104

## 2024-09-25 ENCOUNTER — Telehealth: Payer: Self-pay

## 2024-09-25 ENCOUNTER — Other Ambulatory Visit (HOSPITAL_BASED_OUTPATIENT_CLINIC_OR_DEPARTMENT_OTHER): Payer: Self-pay | Admitting: Family Medicine

## 2024-09-25 DIAGNOSIS — E7800 Pure hypercholesterolemia, unspecified: Secondary | ICD-10-CM

## 2024-09-25 NOTE — Telephone Encounter (Signed)
 Labs received from: [place] on: 09/19/2024  Reviewed by: Dr. Dolphus   Labs drawn:   CRP  A1c  CBC w/ Diff   CMP  ESR  Lipid panel w/ reflex      Results: Hgb A1c 5.8 ESR 24 Cholesterol 265 CHOL/HDL 4.5 NHDL 206 LDL Chol Calc 172

## 2024-10-17 ENCOUNTER — Ambulatory Visit (HOSPITAL_BASED_OUTPATIENT_CLINIC_OR_DEPARTMENT_OTHER)
Admission: RE | Admit: 2024-10-17 | Discharge: 2024-10-17 | Disposition: A | Discharge status: Home / Self Care | Payer: Self-pay | Source: Ambulatory Visit | Attending: Family Medicine | Admitting: Family Medicine

## 2024-10-17 DIAGNOSIS — E7800 Pure hypercholesterolemia, unspecified: Secondary | ICD-10-CM

## 2024-11-06 ENCOUNTER — Other Ambulatory Visit: Payer: Self-pay

## 2024-11-06 DIAGNOSIS — I2510 Atherosclerotic heart disease of native coronary artery without angina pectoris: Secondary | ICD-10-CM | POA: Insufficient documentation

## 2024-11-06 DIAGNOSIS — R7689 Other specified abnormal immunological findings in serum: Secondary | ICD-10-CM | POA: Insufficient documentation

## 2024-11-06 DIAGNOSIS — K219 Gastro-esophageal reflux disease without esophagitis: Secondary | ICD-10-CM | POA: Insufficient documentation

## 2024-11-06 DIAGNOSIS — M2550 Pain in unspecified joint: Secondary | ICD-10-CM | POA: Insufficient documentation

## 2024-11-06 DIAGNOSIS — J301 Allergic rhinitis due to pollen: Secondary | ICD-10-CM | POA: Insufficient documentation

## 2024-11-06 DIAGNOSIS — Z8616 Personal history of COVID-19: Secondary | ICD-10-CM | POA: Insufficient documentation

## 2024-11-06 DIAGNOSIS — Z91018 Allergy to other foods: Secondary | ICD-10-CM | POA: Insufficient documentation

## 2024-11-06 DIAGNOSIS — B009 Herpesviral infection, unspecified: Secondary | ICD-10-CM | POA: Insufficient documentation

## 2024-11-06 DIAGNOSIS — J302 Other seasonal allergic rhinitis: Secondary | ICD-10-CM | POA: Insufficient documentation

## 2024-11-06 DIAGNOSIS — K5730 Diverticulosis of large intestine without perforation or abscess without bleeding: Secondary | ICD-10-CM | POA: Insufficient documentation

## 2024-11-06 DIAGNOSIS — N951 Menopausal and female climacteric states: Secondary | ICD-10-CM | POA: Insufficient documentation

## 2024-11-06 DIAGNOSIS — M542 Cervicalgia: Secondary | ICD-10-CM | POA: Insufficient documentation

## 2024-11-07 ENCOUNTER — Ambulatory Visit

## 2024-11-07 VITALS — BP 130/98 | HR 75 | Ht 64.0 in | Wt 268.0 lb

## 2024-11-07 DIAGNOSIS — Z136 Encounter for screening for cardiovascular disorders: Secondary | ICD-10-CM | POA: Insufficient documentation

## 2024-11-07 DIAGNOSIS — E7800 Pure hypercholesterolemia, unspecified: Secondary | ICD-10-CM | POA: Diagnosis not present

## 2024-11-07 DIAGNOSIS — G4739 Other sleep apnea: Secondary | ICD-10-CM | POA: Insufficient documentation

## 2024-11-07 DIAGNOSIS — R030 Elevated blood-pressure reading, without diagnosis of hypertension: Secondary | ICD-10-CM | POA: Insufficient documentation

## 2024-11-07 DIAGNOSIS — I2510 Atherosclerotic heart disease of native coronary artery without angina pectoris: Secondary | ICD-10-CM

## 2024-11-07 HISTORY — DX: Other sleep apnea: G47.39

## 2024-11-07 HISTORY — DX: Elevated blood-pressure reading, without diagnosis of hypertension: R03.0

## 2024-11-07 HISTORY — DX: Encounter for screening for cardiovascular disorders: Z13.6

## 2024-11-07 NOTE — Assessment & Plan Note (Signed)
 Family history of abdominal aortic aneurysm rupture related to death of her father.  She had secondhand exposure to passive smoking growing up. Proceed with screening abdominal aortic aneurysm ultrasound.

## 2024-11-07 NOTE — Assessment & Plan Note (Signed)
 Coronary atherosclerosis with calcium score 227 on study from 10/17/2024.  No obvious cardiac symptoms but functional status somewhat limited due to musculoskeletal pain and has atypical noncardiac chest pain which seems more like heartburn concern.  Overall given the plaque burden will assess functional capacity with a treadmill EKG stress test.  Agree with continued aspirin and Crestor for overall cardiovascular risk reduction.

## 2024-11-07 NOTE — Patient Instructions (Signed)
 Medication Instructions:  Your physician recommends that you continue on your current medications as directed. Please refer to the Current Medication list given to you today.  *If you need a refill on your cardiac medications before your next appointment, please call your pharmacy*  Lab Work: None If you have labs (blood work) drawn today and your tests are completely normal, you will receive your results only by: MyChart Message (if you have MyChart) OR A paper copy in the mail If you have any lab test that is abnormal or we need to change your treatment, we will call you to review the results.  Testing/Procedures: Your physician has requested that you have an echocardiogram. Echocardiography is a painless test that uses sound waves to create images of your heart. It provides your doctor with information about the size and shape of your heart and how well your hearts chambers and valves are working. This procedure takes approximately one hour. There are no restrictions for this procedure. Please do NOT wear cologne, perfume, aftershave, or lotions (deodorant is allowed). Please arrive 15 minutes prior to your appointment time.  Please note: We ask at that you not bring children with you during ultrasound (echo/ vascular) testing. Due to room size and safety concerns, children are not allowed in the ultrasound rooms during exams. Our front office staff cannot provide observation of children in our lobby area while testing is being conducted. An adult accompanying a patient to their appointment will only be allowed in the ultrasound room at the discretion of the ultrasound technician under special circumstances. We apologize for any inconvenience.  Your physician has requested that you have an abdominal aorta duplex. During this test, an ultrasound is used to evaluate the aorta. Allow 30 minutes for this exam. Do not eat after midnight the day before and avoid carbonated beverages.  Please note:  We ask at that you not bring children with you during ultrasound (echo/ vascular) testing. Due to room size and safety concerns, children are not allowed in the ultrasound rooms during exams. Our front office staff cannot provide observation of children in our lobby area while testing is being conducted. An adult accompanying a patient to their appointment will only be allowed in the ultrasound room at the discretion of the ultrasound technician under special circumstances. We apologize for any inconvenience.   Treadmill Stress Test Instructions:    1. You may take all of your medications.  2. No food, drink or tobacco products 2 hours prior to your test.  3. Dress prepared to exercise. Best to wear 2 piece outfit and tennis shoes. Shoes must be closed toe.  4. Please bring all current prescription medications.   Follow-Up: At [facility], you and your health needs are our priority.  As part of our continuing mission to provide you with exceptional heart care, our providers are all part of one team.  This team includes your primary Cardiologist (physician) and Advanced Practice Providers or APPs (Physician Assistants and Nurse Practitioners) who all work together to provide you with the care you need, when you need it.  Your next appointment:   2 month(s)  Provider:   Alean Kobus, MD    We recommend signing up for the patient portal called MyChart.  Sign up information is provided on this After Visit Summary.  MyChart is used to connect with patients for Virtual Visits (Telemedicine).  Patients are able to view lab/test results, encounter notes, upcoming appointments, etc.  Non-urgent messages can be  sent to your provider as well.   To learn more about what you can do with MyChart, go to [email or web address].   Other Instructions Please keep a BP log for 2 weeks and send by MyChart or mail.                      Dr. Liborio [address] [place], [place]  [address]  Blood Pressure Record Sheet To take your blood pressure, you will need a blood pressure machine. You can buy a blood pressure machine (blood pressure monitor) at your clinic, drug store, or online. When choosing one, consider: An automatic monitor that has an arm cuff. A cuff that wraps snugly around your upper arm. You should be able to fit only one finger between your arm and the cuff. A device that stores blood pressure reading results. Do not choose a monitor that measures your blood pressure from your wrist or finger. Follow your health care provider's instructions for how to take your blood pressure. To use this form: Get one reading in the morning (a.m.) 1-2 hours after you take any medicines. Get one reading in the evening (p.m.) before supper.   Blood pressure log Date: _______________________  a.m. _____________________(1st reading) HR___________            p.m. _____________________(2nd reading) HR__________  Date: _______________________  a.m. _____________________(1st reading) HR___________            p.m. _____________________(2nd reading) HR__________  Date: _______________________  a.m. _____________________(1st reading) HR___________            p.m. _____________________(2nd reading) HR__________  Date: _______________________  a.m. _____________________(1st reading) HR___________            p.m. _____________________(2nd reading) HR__________  Date: _______________________  a.m. _____________________(1st reading) HR___________            p.m. _____________________(2nd reading) HR__________  Date: _______________________  a.m. _____________________(1st reading) HR___________            p.m. _____________________(2nd reading) HR__________  Date: _______________________  a.m. _____________________(1st reading) HR___________            p.m. _____________________(2nd reading) HR__________   This information is not intended to replace advice  given to you by your health care provider. Make sure you discuss any questions you have with your health care provider. Document Revised: 03/05/2020 Document Reviewed: 03/05/2020 Elsevier Patient Education  2021 [employer].

## 2024-11-07 NOTE — Assessment & Plan Note (Signed)
 Discussed about lifestyle and diet modifications and regular exercise to target weight loss. If no significant change despite consistent dietary and lifestyle modifications, would be a candidate to consider weight loss management with agent such as GLP agonist.

## 2024-11-07 NOTE — Assessment & Plan Note (Signed)
 Hyperlipidemia with elevated LDL. Target LDL less than 70 mg/dL.  Agree with Crestor 20 mg once daily. Tolerating well. Defer management to PCP for monitoring and titrating up the dose as needed.  If lipid panel not at target and follow-up in 3 months would recommend adding Zetia and/or PCSK9 inhibitors.

## 2024-11-07 NOTE — Assessment & Plan Note (Addendum)
 Advised to keep a log of blood pressure at home if possible. Target blood pressure below 130/80 mmHg.  If consistently above these numbers will require antihypertensive medications.  Has reported elevated readings at prior PCP office visits 2. Will assess with echocardiogram to rule out any endorgan damage.

## 2024-11-07 NOTE — Assessment & Plan Note (Signed)
 Recommend further evaluation with PCP and undergo sleep study if indicated.

## 2024-11-07 NOTE — Progress Notes (Signed)
 Cardiology Consultation:    Date:  11/07/2024   ID:  Stacey Flores, DOB [birth year], MRN [id number]  PCP:  Leonel Cole, MD  Cardiologist:  Alean JONELLE Kobus, MD   Referring MD: Leonel Cole, MD   No chief complaint on file.    ASSESSMENT AND PLAN:   Stacey Flores 53 year old woman  history of obesity, hyperlipidemia, elevated blood pressure without diagnosis of hypertension, sleep apnea-like behavior, prediabetes, hiatal hernia, GERD, migraine.  Chronic myofascial pain and following up with rheumatologist. With calcium score 10/17/2024 noted to be 227. Non-smoker, had exposure to passive smoking from parents.   Reports family history father passing away from ruptured abdominal aortic aneurysm.   Here for further evaluation in setting of calcium score being elevated and for further cardiovascular risk assessment.  Problem List Items Addressed This Visit     Hypercholesterolemia   Hyperlipidemia with elevated LDL. Target LDL less than 70 mg/dL.  Agree with Crestor 20 mg once daily. Tolerating well. Defer management to PCP for monitoring and titrating up the dose as needed.  If lipid panel not at target and follow-up in 3 months would recommend adding Zetia and/or PCSK9 inhibitors.       Relevant Medications   aspirin EC (ASPIRIN ADULT LOW STRENGTH) 81 MG tablet   CAD (coronary artery disease) - Primary   Coronary atherosclerosis with calcium score 227 on study from 10/17/2024.  No obvious cardiac symptoms but functional status somewhat limited due to musculoskeletal pain and has atypical noncardiac chest pain which seems more like heartburn concern.  Overall given the plaque burden will assess functional capacity with a treadmill EKG stress test.  Agree with continued aspirin and Crestor for overall cardiovascular risk reduction.       Relevant Medications   aspirin EC (ASPIRIN ADULT LOW STRENGTH) 81 MG tablet   Other Relevant Orders   EKG 12-Lead (Completed)    VAS US  AAA DUPLEX   Exercise Tolerance Test   Morbid obesity (HCC)   Discussed about lifestyle and diet modifications and regular exercise to target weight loss. If no significant change despite consistent dietary and lifestyle modifications, would be a candidate to consider weight loss management with agent such as GLP agonist.       Elevated blood pressure reading in office without diagnosis of hypertension   Advised to keep a log of blood pressure at home if possible. Target blood pressure below 130/80 mmHg.  If consistently above these numbers will require antihypertensive medications.  Has reported elevated readings at prior PCP office visits 2. Will assess with echocardiogram to rule out any endorgan damage.      Relevant Orders   ECHOCARDIOGRAM COMPLETE   Sleep apnea-like behavior   Recommend further evaluation with PCP and undergo sleep study if indicated.       Screening for AAA (abdominal aortic aneurysm)   Family history of abdominal aortic aneurysm rupture related to death of her father.  She had secondhand exposure to passive smoking growing up. Proceed with screening abdominal aortic aneurysm ultrasound.      Relevant Orders   VAS US  AAA DUPLEX   Return to clinic tentatively in 2 months.   History of Present Illness:    Stacey Flores is a 53 y.o. female who is being seen today for the evaluation of elevated coronary calcium score.  At the request of Leonel Cole, MD at [facility].  Pleasant woman here for the visit by herself.  Works for [employer]  department and job does involve going to schools and helping out in [employer] as needed.  Has history of obesity, hyperlipidemia, elevated blood pressure without diagnosis of hypertension, sleep apnea-like behavior, prediabetes, hiatal hernia, GERD, migraine.  Chronic myofascial pain and following up with rheumatologist. With calcium score 10/17/2024 noted to be 227. Non-smoker, had  exposure to passive smoking from parents.   Reports family history father passing away from ruptured abdominal aortic aneurysm.  Mentions her biggest issue has been dietary and lifestyle with reduced activity over the last several months notably since her dog passed away in 01-31-24.  Prior to that she was regularly walking her dog. Notes overall reduced effort tolerance at times but keeps up with her day-to-day activities at home and work without any significant limitation.  Her Apple smart watch has alerted for disturbed sleep and possible sleep apnea.  Does have atypical chest discomfort more consistent with heartburn like symptoms Denies any chest pain suggestive of angina, shortness of breath, orthopnea, paroxysmal nocturnal dyspnea. No pedal edema. No lightheadedness, dizziness or syncopal episodes.  Does not routinely monitor blood pressures at home.  Mentions today's blood pressures being elevated likely due to her being upset and coming here from work.  EKG in the clinic today shows sinus rhythm heart rate 75/min, PR interval 174 ms, QRS duration 76 ms, QTc normal 490 ms no ischemic changes.  Good compliance with medications. Has been started on aspirin and Crestor after the calcium score study.  Has been taking this for the past week without any side effects.  Blood work from PCPs office 09/19/2024 LDL 172, HDL 60, total cholesterol 734 and triglycerides 186. Hemoglobin A1c 5.8 BUN 16, creatinine 0.68 Normal transaminases and alkaline phosphatase Hemoglobin 12.9 and hematocrit 39.2. ESR mildly elevated 24. CRP normal 7. Calcium score study done 10/17/2024 notes total calcium score of 227 [predominantly RCA 205; LAD 22] extracardiac findings notable for small hiatal hernia.   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   Elevated antinuclear antibody (ANA) level 11/06/2024   GERD (gastroesophageal reflux disease)    Herpesviral infection, unspecified 11/06/2024   History of diverticulosis 06/30/2023   History of esophageal dilatation 06/30/2023   History of severe acute respiratory syndrome coronavirus 2 (SARS-CoV-2) disease 11/06/2024   HSV infection    Hypercholesterolemia 06/30/2023   Joint pain 11/06/2024   Lateral epicondylitis of right elbow 10/20/2022   Morbid obesity (HCC) 11/06/2024   Myofascial pain 06/30/2023   methocarbamol  500 mg 1 tablet daily as needed during flares     Neck pain 11/06/2024   Prediabetes 06/30/2023   Primary osteoarthritis of both feet 06/30/2023   X-rays on 11/03/22 of both feet were consistent with early osteoarthritis.     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  Ultrasound of both hands was negative for synovitis on 12/16/2022.     Rosacea 06/30/2023   Seasonal allergies    Vasomotor symptoms due to menopause 11/06/2024    Past Surgical History:  Procedure Laterality Date   BREAST BIOPSY Left  benign   BREAST EXCISIONAL BIOPSY     COLONOSCOPY     TOTAL LAPAROSCOPIC HYSTERECTOMY WITH SALPINGECTOMY Bilateral 03/15/2018   Procedure: TOTAL LAPAROSCOPIC HYSTERECTOMY WITH SALPINGECTOMY;  Surgeon: Ozan, Jennifer, DO;  Location: [facility];  Service: Gynecology;  Laterality: Bilateral;   TUBAL LIGATION     UPPER GI ENDOSCOPY     WISDOM TOOTH EXTRACTION      Current Medications: Current Meds  Medication Sig   Acetaminophen  (TYLENOL  PO) Take by mouth as needed.   aspirin EC (ASPIRIN ADULT LOW STRENGTH) 81 MG tablet Take 81 mg by mouth daily.   DULoxetine  (CYMBALTA ) 30 MG  capsule Take 1 capsule by mouth in the morning   fexofenadine (ALLEGRA) 180 MG tablet Take 180 mg by mouth daily.   fluticasone (FLONASE) 50 MCG/ACT nasal spray Place 1 spray into both nostrils daily.   methocarbamol  (ROBAXIN ) 500 MG tablet TAKE 1 TABLET BY MOUTH ONCE DAILY AS NEEDED FOR MUSCLE SPASM   montelukast (SINGULAIR) 10 MG tablet Take 10 mg by mouth daily. (Patient taking differently: Take 10 mg by mouth daily as needed.)   Naproxen Sodium (ALEVE PO) Take by mouth as needed.   pantoprazole  (PROTONIX ) 40 MG tablet Take 40 mg by mouth every morning.   rosuvastatin (CRESTOR) 20 MG tablet Take 20 mg by mouth daily.   valACYclovir (VALTREX) 500 MG tablet Take 500 mg by mouth daily.     Allergies:   Patient has no known allergies.   Social History   Socioeconomic History   Marital status: Divorced    Spouse name: Not on file   Number of children: Not on file   Years of education: Not on file   Highest education level: Not on file  Occupational History   Not on file  Tobacco Use   Smoking status: Never    Passive exposure: Past   Smokeless tobacco: Never  Vaping Use   Vaping status: Never Used  Substance and Sexual Activity   Alcohol use: Not Currently    Comment: Only have a couple of drinks per year at birthdays or celebration but not every year, mostly a non drinker   Drug use: Never   Sexual activity: Yes    Birth control/protection: Surgical  Other Topics Concern   Not on file  Social History Narrative   Not on file   Social Drivers of Health   Financial Resource Strain: Not on file  Food Insecurity: Not on file  Transportation Needs: Not on file  Physical Activity: Not on file  Stress: Not on file  Social Connections: Not on file     Family History: The patient's family history includes AAA (abdominal aortic aneurysm) in her father; Breast cancer in her mother. ROS:   Please see the history of present illness.    All 14 point review of systems negative  except as described per history of present illness.  EKGs/Labs/Other Studies Reviewed:    The following studies were reviewed today:   EKG:  EKG Interpretation Date/Time:  Wednesday November 07 2024 08:02:24 EST Ventricular Rate:  75 PR Interval:  174 QRS Duration:  76 QT Interval:  376 QTC Calculation: 419 R Axis:   10  Text Interpretation: Normal sinus rhythm Normal ECG No previous ECGs available Confirmed by Liborio Hai reddy [phone]) on 11/07/2024 8:16:17 AM    Recent Labs: No results found for requested labs within last 365 days.  Recent Lipid Panel No results found for: CHOL, TRIG, HDL, CHOLHDL, VLDL, LDLCALC, LDLDIRECT  Physical Exam:    VS:  BP (!) 150/108   Pulse 75   Ht 5' 4 (1.626 m)   Wt 268 lb (121.6 kg)   LMP  (LMP Unknown) Comment: Cont.bleeding  SpO2 99%   BMI 46.00 kg/m     Wt Readings from Last 3 Encounters:  11/07/24 268 lb (121.6 kg)  08/24/24 263 lb 3.2 oz (119.4 kg)  02/10/24 267 lb 9.6 oz (121.4 kg)     GENERAL:  Well nourished, well developed in no acute distress NECK: No JVD; No carotid bruits CARDIAC: RRR, S1 and S2 present, no murmurs, no rubs, no gallops CHEST:  Clear to auscultation without rales, wheezing or rhonchi  Extremities: No pitting pedal edema. Pulses bilaterally symmetric with radial 2+ and dorsalis pedis 2+ NEUROLOGIC:  Alert and oriented x 3  Medication Adjustments/Labs and Tests Ordered: Current medicines are reviewed at length with the patient today.  Concerns regarding medicines are outlined above.  Orders Placed This Encounter  Procedures   Exercise Tolerance Test   EKG 12-Lead   ECHOCARDIOGRAM COMPLETE   VAS US  AAA DUPLEX   No orders of the defined types were placed in this encounter.   Signed, Alean jess Kobus, MD, MPH, [facility]. 11/07/2024 8:48 AM    [facility]

## 2024-11-28 ENCOUNTER — Other Ambulatory Visit: Payer: Self-pay

## 2024-11-28 DIAGNOSIS — R0789 Other chest pain: Secondary | ICD-10-CM

## 2024-11-28 DIAGNOSIS — E7800 Pure hypercholesterolemia, unspecified: Secondary | ICD-10-CM

## 2024-11-28 DIAGNOSIS — G4739 Other sleep apnea: Secondary | ICD-10-CM

## 2024-11-28 DIAGNOSIS — R030 Elevated blood-pressure reading, without diagnosis of hypertension: Secondary | ICD-10-CM

## 2024-11-28 DIAGNOSIS — I2510 Atherosclerotic heart disease of native coronary artery without angina pectoris: Secondary | ICD-10-CM

## 2024-11-28 DIAGNOSIS — Z136 Encounter for screening for cardiovascular disorders: Secondary | ICD-10-CM

## 2024-11-30 ENCOUNTER — Ambulatory Visit: Payer: Self-pay

## 2024-11-30 ENCOUNTER — Ambulatory Visit

## 2024-11-30 DIAGNOSIS — R0789 Other chest pain: Secondary | ICD-10-CM

## 2024-11-30 DIAGNOSIS — I2510 Atherosclerotic heart disease of native coronary artery without angina pectoris: Secondary | ICD-10-CM | POA: Diagnosis not present

## 2024-11-30 LAB — EXERCISE TOLERANCE TEST
Angina Index: 0
Base ST Depression (mm): 0 mm
Duke Treadmill Score: 6
Estimated workload: 7.9
Exercise duration (min): 6 min
Exercise duration (sec): 19 s
Peak HR: 169 {beats}/min
Percent HR: 101 %
Rest HR: 70 {beats}/min
ST Depression (mm): 0 mm

## 2024-11-30 MED ORDER — AMLODIPINE BESYLATE 2.5 MG PO TABS: 2.5000 mg | ORAL_TABLET | Freq: Every day | ORAL | 3 refills | Status: AC

## 2024-11-30 NOTE — Telephone Encounter (Signed)
 Results reviewed with pt as per Dr. Madireddy's note.  Pt verbalized understanding and had no additional questions. Routed to PCP.

## 2024-12-05 ENCOUNTER — Ambulatory Visit

## 2024-12-05 ENCOUNTER — Ambulatory Visit (INDEPENDENT_AMBULATORY_CARE_PROVIDER_SITE_OTHER)

## 2024-12-05 DIAGNOSIS — I2510 Atherosclerotic heart disease of native coronary artery without angina pectoris: Secondary | ICD-10-CM

## 2024-12-05 DIAGNOSIS — R030 Elevated blood-pressure reading, without diagnosis of hypertension: Secondary | ICD-10-CM

## 2024-12-05 DIAGNOSIS — Z136 Encounter for screening for cardiovascular disorders: Secondary | ICD-10-CM

## 2024-12-05 LAB — ECHOCARDIOGRAM COMPLETE
AR max vel: 2.22 cm2
AV Area VTI: 2.42 cm2
AV Area mean vel: 2.3 cm2
AV Mean grad: 4.5 mmHg
AV Peak grad: 8 mmHg
Ao pk vel: 1.42 m/s
Area-P 1/2: 3.31 cm2
MV VTI: 2.1 cm2
P 1/2 time: 459 ms
S' Lateral: 3.4 cm

## 2024-12-06 ENCOUNTER — Ambulatory Visit: Payer: Self-pay

## 2024-12-06 DIAGNOSIS — I714 Abdominal aortic aneurysm, without rupture, unspecified: Secondary | ICD-10-CM

## 2024-12-06 DIAGNOSIS — I351 Nonrheumatic aortic (valve) insufficiency: Secondary | ICD-10-CM

## 2024-12-06 HISTORY — DX: Abdominal aortic aneurysm, without rupture, unspecified: I71.40

## 2024-12-06 HISTORY — DX: Nonrheumatic aortic (valve) insufficiency: I35.1

## 2025-01-07 ENCOUNTER — Ambulatory Visit

## 2025-02-22 ENCOUNTER — Ambulatory Visit: Admitting: Physician Assistant
